# Patient Record
Sex: Female | Born: 1965 | Race: White | Hispanic: No | Marital: Married | Smoking: Never smoker
Health system: Southern US, Community
[De-identification: ages and names within clinical notes are randomized; demographics above are authoritative.]

## PROBLEM LIST (undated history)

## (undated) DIAGNOSIS — M5126 Other intervertebral disc displacement, lumbar region: Secondary | ICD-10-CM

## (undated) DIAGNOSIS — D649 Anemia, unspecified: Secondary | ICD-10-CM

## (undated) DIAGNOSIS — M5136 Other intervertebral disc degeneration, lumbar region: Secondary | ICD-10-CM

## (undated) DIAGNOSIS — Z860100 Personal history of colon polyps, unspecified: Secondary | ICD-10-CM

## (undated) DIAGNOSIS — M1990 Unspecified osteoarthritis, unspecified site: Secondary | ICD-10-CM

## (undated) DIAGNOSIS — T7840XA Allergy, unspecified, initial encounter: Secondary | ICD-10-CM

## (undated) DIAGNOSIS — N200 Calculus of kidney: Secondary | ICD-10-CM

## (undated) DIAGNOSIS — M51369 Other intervertebral disc degeneration, lumbar region without mention of lumbar back pain or lower extremity pain: Secondary | ICD-10-CM

## (undated) HISTORY — PX: ADENOIDECTOMY: SUR15

## (undated) HISTORY — PX: GALLBLADDER SURGERY: SHX652

## (undated) HISTORY — DX: Unspecified osteoarthritis, unspecified site: M19.90

## (undated) HISTORY — DX: Personal history of colonic polyps: Z86.010

## (undated) HISTORY — DX: Other intervertebral disc displacement, lumbar region: M51.26

## (undated) HISTORY — PX: UPPER GASTROINTESTINAL ENDOSCOPY: SHX188

## (undated) HISTORY — PX: TYMPANOSTOMY TUBE PLACEMENT: SHX32

## (undated) HISTORY — PX: TONSILLECTOMY: SUR1361

## (undated) HISTORY — PX: POLYPECTOMY: SHX149

## (undated) HISTORY — DX: Other intervertebral disc degeneration, lumbar region without mention of lumbar back pain or lower extremity pain: M51.369

## (undated) HISTORY — PX: EPIDURAL BLOCK INJECTION: SHX1516

## (undated) HISTORY — DX: Anemia, unspecified: D64.9

## (undated) HISTORY — PX: CHOLECYSTECTOMY: SHX55

## (undated) HISTORY — PX: OTHER SURGICAL HISTORY: SHX169

## (undated) HISTORY — DX: Allergy, unspecified, initial encounter: T78.40XA

## (undated) HISTORY — DX: Personal history of colon polyps, unspecified: Z86.0100

## (undated) HISTORY — DX: Other intervertebral disc degeneration, lumbar region: M51.36

## (undated) HISTORY — DX: Calculus of kidney: N20.0

## (undated) HISTORY — PX: ABDOMINAL HYSTERECTOMY: SHX81

---

## 1997-09-07 ENCOUNTER — Other Ambulatory Visit: Admission: RE | Admit: 1997-09-07 | Discharge: 1997-09-07 | Payer: Self-pay | Admitting: *Deleted

## 1997-09-22 ENCOUNTER — Other Ambulatory Visit: Admission: RE | Admit: 1997-09-22 | Discharge: 1997-09-22 | Payer: Self-pay | Admitting: *Deleted

## 1998-09-15 ENCOUNTER — Other Ambulatory Visit: Admission: RE | Admit: 1998-09-15 | Discharge: 1998-09-15 | Payer: Self-pay | Admitting: *Deleted

## 1999-05-06 ENCOUNTER — Other Ambulatory Visit: Admission: RE | Admit: 1999-05-06 | Discharge: 1999-05-06 | Payer: Self-pay | Admitting: *Deleted

## 1999-05-06 ENCOUNTER — Encounter (INDEPENDENT_AMBULATORY_CARE_PROVIDER_SITE_OTHER): Payer: Self-pay | Admitting: Specialist

## 2000-09-12 ENCOUNTER — Encounter (INDEPENDENT_AMBULATORY_CARE_PROVIDER_SITE_OTHER): Payer: Self-pay | Admitting: Specialist

## 2000-09-12 ENCOUNTER — Other Ambulatory Visit: Admission: RE | Admit: 2000-09-12 | Discharge: 2000-09-12 | Payer: Self-pay | Admitting: *Deleted

## 2002-03-04 ENCOUNTER — Other Ambulatory Visit: Admission: RE | Admit: 2002-03-04 | Discharge: 2002-03-04 | Payer: Self-pay | Admitting: *Deleted

## 2007-12-24 ENCOUNTER — Encounter (INDEPENDENT_AMBULATORY_CARE_PROVIDER_SITE_OTHER): Payer: Self-pay | Admitting: *Deleted

## 2007-12-24 ENCOUNTER — Ambulatory Visit (HOSPITAL_BASED_OUTPATIENT_CLINIC_OR_DEPARTMENT_OTHER): Admission: RE | Admit: 2007-12-24 | Discharge: 2007-12-24 | Payer: Self-pay | Admitting: *Deleted

## 2009-05-12 ENCOUNTER — Ambulatory Visit: Payer: Self-pay | Admitting: Radiology

## 2009-05-12 ENCOUNTER — Emergency Department (HOSPITAL_BASED_OUTPATIENT_CLINIC_OR_DEPARTMENT_OTHER): Admission: EM | Admit: 2009-05-12 | Discharge: 2009-05-12 | Payer: Self-pay | Admitting: Emergency Medicine

## 2010-07-17 LAB — POCT CARDIAC MARKERS: Troponin i, poc: <0.05 ng/mL (ref 0.00–0.09)

## 2010-09-13 NOTE — Op Note (Signed)
NAMEJANDA, CARGO               ACCOUNT NO.:  [id number]   MEDICAL RECORD NO.:  [id number]          PATIENT TYPE:  AMB   LOCATION:  DSC                          FACILITY:  [facility]   PHYSICIAN:  Alfonse Ras, MD   DATE OF BIRTH:  [birth year]   DATE OF PROCEDURE:  12/24/2007  DATE OF DISCHARGE:                               OPERATIVE REPORT   PREOPERATIVE DIAGNOSIS:  Subcutaneous scalp mass.   POSTOPERATIVE DIAGNOSIS:  Subcutaneous scalp mass.   PROCEDURE:  Excision of subcutaneous scalp mass.   ANESTHESIA:  Local MAC.   DESCRIPTION OF PROCEDURE:  The patient was taken to the operating room  and placed in the supine position.  After adequate MAC anesthesia was  induced, a small area of hair was shaved overlying the mass in the  midportion of the scalp.  Lidocaine 1% with epinephrine and bicarb were  injected in the subcutaneous tissues.  An elliptical incision was made  over the mass.  The cyst was removed in its entirety and sent for  pathologic evaluation.  Adequate hemostasis was ensured, and the skin  was closed with interrupted 3-0 Monocryls.  Dermabond dressing was  placed.  The patient tolerated the procedure well and went to PACU in  good condition.      Alfonse Ras, MD  Electronically Signed     KRE/MEDQ  D:  12/24/2007  T:  12/25/2007  Job:  [phone]

## 2011-06-21 ENCOUNTER — Encounter (HOSPITAL_BASED_OUTPATIENT_CLINIC_OR_DEPARTMENT_OTHER): Payer: Self-pay | Admitting: *Deleted

## 2011-06-21 ENCOUNTER — Emergency Department (HOSPITAL_BASED_OUTPATIENT_CLINIC_OR_DEPARTMENT_OTHER)
Admission: EM | Admit: 2011-06-21 | Discharge: 2011-06-21 | Disposition: A | Discharge status: Home / Self Care | Payer: BC Managed Care – PPO | Attending: Emergency Medicine | Admitting: Emergency Medicine

## 2011-06-21 ENCOUNTER — Emergency Department (INDEPENDENT_AMBULATORY_CARE_PROVIDER_SITE_OTHER): Payer: BC Managed Care – PPO

## 2011-06-21 DIAGNOSIS — R51 Headache: Secondary | ICD-10-CM

## 2011-06-21 DIAGNOSIS — S7000XA Contusion of unspecified hip, initial encounter: Secondary | ICD-10-CM

## 2011-06-21 DIAGNOSIS — M25559 Pain in unspecified hip: Secondary | ICD-10-CM

## 2011-06-21 DIAGNOSIS — W010XXA Fall on same level from slipping, tripping and stumbling without subsequent striking against object, initial encounter: Secondary | ICD-10-CM | POA: Insufficient documentation

## 2011-06-21 DIAGNOSIS — S0003XA Contusion of scalp, initial encounter: Secondary | ICD-10-CM | POA: Insufficient documentation

## 2011-06-21 DIAGNOSIS — S0093XA Contusion of unspecified part of head, initial encounter: Secondary | ICD-10-CM

## 2011-06-21 DIAGNOSIS — S0990XA Unspecified injury of head, initial encounter: Secondary | ICD-10-CM

## 2011-06-21 DIAGNOSIS — W19XXXA Unspecified fall, initial encounter: Secondary | ICD-10-CM

## 2011-06-21 DIAGNOSIS — Y9289 Other specified places as the place of occurrence of the external cause: Secondary | ICD-10-CM | POA: Insufficient documentation

## 2011-06-21 DIAGNOSIS — M169 Osteoarthritis of hip, unspecified: Secondary | ICD-10-CM

## 2011-06-21 MED ORDER — OXYCODONE-ACETAMINOPHEN 5-325 MG PO TABS: 1.0000 | ORAL_TABLET | ORAL | Status: AC | PRN

## 2011-06-21 MED ORDER — ONDANSETRON 8 MG PO TBDP
8.0000 mg | ORAL_TABLET | Freq: Once | ORAL | Status: AC
  Administered 2011-06-21: 8 mg via ORAL
  Filled 2011-06-21: qty 1

## 2011-06-21 MED ORDER — ONDANSETRON 8 MG PO TBDP: 8.0000 mg | ORAL_TABLET | Freq: Three times a day (TID) | ORAL | Status: AC | PRN

## 2011-06-21 MED ORDER — MORPHINE SULFATE 4 MG/ML IJ SOLN
4.0000 mg | Freq: Once | INTRAMUSCULAR | Status: AC
  Administered 2011-06-21: 4 mg via INTRAMUSCULAR
  Filled 2011-06-21: qty 1

## 2011-06-21 NOTE — ED Provider Notes (Signed)
History     CSN: [id number]  Arrival date & time 06/21/11  4098   First MD Initiated Contact with Patient 06/21/11 [phone]      Chief Complaint  Patient presents with  . Fall  . Head Injury    (Consider location/radiation/quality/duration/timing/severity/associated sxs/prior treatment) Patient is a 46 y.o. female presenting with fall and head injury. The history is provided by the patient.  Fall The accident occurred less than 1 hour ago. The fall occurred while standing. She fell from a height of 3 to 5 ft. She landed on concrete. The point of impact was the right hip and head. The pain is present in the head. The pain is at a severity of 8/10. She was ambulatory at the scene. There was no entrapment after the fall. There was no drug use involved in the accident. There was no alcohol use involved in the accident. Associated symptoms include headaches. Pertinent negatives include no visual change, no fever, no numbness, no abdominal pain, no bowel incontinence, no nausea, no vomiting, no hearing loss, no loss of consciousness and no tingling. The symptoms are aggravated by standing and ambulation. She has tried nothing for the symptoms.  Head Injury  Pertinent negatives include no numbness and no vomiting.    History reviewed. No pertinent past medical history.  Past Surgical History  Procedure Date  . Abdominal hysterectomy     History reviewed. No pertinent family history.  History  Substance Use Topics  . Smoking status: Never Smoker   . Smokeless tobacco: Not on file  . Alcohol Use: Yes     occ    OB History    Grav Para Term Preterm Abortions TAB SAB Ect Mult Living                  Review of Systems  Constitutional: Negative for fever.  Gastrointestinal: Negative for nausea, vomiting, abdominal pain and bowel incontinence.  Neurological: Positive for headaches. Negative for tingling, loss of consciousness and numbness.  All other systems reviewed and are  negative.    Allergies  Erythrocin  Home Medications   Current Outpatient Rx  Name Route Sig Dispense Refill  . ESTROGENS CONJ SYNTHETIC A 0.3 MG PO TABS Oral Take 0.3 mg by mouth daily.      BP 120/80  Pulse 70  Temp(Src) 97.7 F (36.5 C) (Oral)  Resp 18  Ht 5\' 7"  (1.702 m)  Wt 142 lb (64.411 kg)  BMI 22.24 kg/m2  SpO2 99%  Physical Exam  Vitals reviewed. Constitutional: She is oriented to person, place, and time. She appears well-developed and well-nourished.  HENT:  Head: Normocephalic. Head is without raccoon's eyes and without Battle's sign.    Right Ear: Tympanic membrane, external ear and ear canal normal.  Left Ear: Tympanic membrane, external ear and ear canal normal.  Nose: Nose normal.  Mouth/Throat: Uvula is midline, oropharynx is clear and moist and mucous membranes are normal.  Eyes: Conjunctivae and EOM are normal. Pupils are equal, round, and reactive to light.  Neck: Normal range of motion. Neck supple.  Cardiovascular: Normal rate, regular rhythm and normal heart sounds.   Pulmonary/Chest: Effort normal and breath sounds normal.  Abdominal: Soft. Bowel sounds are normal.  Musculoskeletal: Normal range of motion.  Neurological: She is alert and oriented to person, place, and time. She has normal reflexes.  Skin: Skin is warm and dry.  Psychiatric: She has a normal mood and affect.    ED Course  Procedures (including  critical care time)  Labs Reviewed - No data to display No results found.   No diagnosis found.    MDM          Hilario Quarry, MD 06/22/11 [phone]

## 2011-06-21 NOTE — ED Notes (Signed)
Slipped on patch of ice fell backwards hematoma on back of head and right hip pain states everything went black for a brief second

## 2011-06-21 NOTE — ED Notes (Signed)
Patient transported to CT 

## 2011-06-21 NOTE — Discharge Instructions (Signed)
Contusion A contusion is a deep bruise. Bruises happen when an injury causes bleeding under the skin. Signs of bruising include pain, puffiness (swelling), and discolored skin. The bruise may turn blue, purple, or yellow. HOME CARE   Rest the injured area until the pain and puffiness are better.   Try to limit use of the injured area as much as possible or as told by your doctor.   Put ice on the injured area.   Put ice in a plastic bag.   Place a towel between your skin and the bag.   Leave the ice on for 15 to 20 minutes, 3 to 4 times a day.   Raise (elevate) the injured area above the level of the heart.   Use an elastic bandage to lessen puffiness and motion.   Only take medicine as told by your doctor.   Eat healthy.   See your doctor for a follow-up visit.  GET HELP RIGHT AWAY IF:   There is more redness, puffiness, or pain.   You have a headache, muscle ache, or you feel dizzy and ill.   You have a fever.   The pain is not controlled with medicine.   The bruise is not getting better.   There is yellowish white fluid (pus) coming from the wound.   You lose feeling (numbness) in the injured area.   The bruised area feels cold.   There are new problems.  MAKE SURE YOU:   Understand these instructions.   Will watch your condition.   Will get help right away if you are not doing well or get worse.  Document Released: 10/04/2007 Document Revised: 12/28/2010 Document Reviewed: 10/04/2007 [facility] Patient Information 2012 [place], [place].Head Injury, Adult You have had a head injury that does not appear serious at this time. A concussion is a state of changed mental ability, usually from a blow to the head. You should take clear liquids for the rest of the day and then resume your regular diet. You should not take sedatives or alcoholic beverages for as long as directed by your caregiver after discharge. After injuries such as yours, most problems occur within the  first 24 hours. SYMPTOMS These minor symptoms may be experienced after discharge:  Memory difficulties.   Dizziness.   Headaches.   Double vision.   Hearing difficulties.   Depression.   Tiredness.   Weakness.   Difficulty with concentration.  If you experience any of these problems, you should not be alarmed. A concussion requires a few days for recovery. Many patients with head injuries frequently experience such symptoms. Usually, these problems disappear without medical care. If symptoms last for more than one day, notify your caregiver. See your caregiver sooner if symptoms are becoming worse rather than better. HOME CARE INSTRUCTIONS   During the next 24 hours you must stay with someone who can watch you for the warning signs listed below.  Although it is unlikely that serious side effects will occur, you should be aware of signs and symptoms which may necessitate your return to this location. Side effects may occur up to 7 - 10 days following the injury. It is important for you to carefully monitor your condition and contact your caregiver or seek immediate medical attention if there is a change in your condition. SEEK IMMEDIATE MEDICAL CARE IF:   There is confusion or drowsiness.   You can not awaken the injured person.   There is nausea (feeling sick to your stomach) or  continued, forceful vomiting.   You notice dizziness or unsteadiness which is getting worse, or inability to walk.   You have convulsions or unconsciousness.   You experience severe, persistent headaches not relieved by over-the-counter or prescription medicines for pain. (Do not take aspirin as this impairs clotting abilities). Take other pain medications only as directed.   You can not use arms or legs normally.   There is clear or bloody discharge from the nose or ears.  MAKE SURE YOU:   Understand these instructions.   Will watch your condition.   Will get help right away if you are not  doing well or get worse.  Document Released: 04/17/2005 Document Revised: 12/28/2010 Document Reviewed: 03/05/2009 [facility] Patient Information 2012 [place], [place].

## 2011-06-21 NOTE — ED Notes (Signed)
Patient transported to X-ray 

## 2016-05-01 HISTORY — PX: COLONOSCOPY: SHX174

## 2016-09-08 ENCOUNTER — Ambulatory Visit (INDEPENDENT_AMBULATORY_CARE_PROVIDER_SITE_OTHER): Payer: BLUE CROSS/BLUE SHIELD | Admitting: Allergy & Immunology

## 2016-09-08 ENCOUNTER — Encounter: Payer: Self-pay | Admitting: Allergy & Immunology

## 2016-09-08 VITALS — BP 118/70 | HR 96 | Temp 97.7°F | Resp 16

## 2016-09-08 DIAGNOSIS — J301 Allergic rhinitis due to pollen: Secondary | ICD-10-CM

## 2016-09-08 MED ORDER — LEVOCETIRIZINE DIHYDROCHLORIDE 5 MG PO TABS: 5.0000 mg | ORAL_TABLET | Freq: Every evening | ORAL | 5 refills | Status: DC

## 2016-09-08 MED ORDER — MONTELUKAST SODIUM 10 MG PO TABS: 10.0000 mg | ORAL_TABLET | Freq: Every day | ORAL | 5 refills | Status: DC

## 2016-09-08 NOTE — Patient Instructions (Addendum)
1. Chronic allergic rhinitis - Testing today showed: grasses, weeds, trees, molds, dust mite, and cat. - Avoidance measures provided.  - Continue with fluticasone two sprays per nostril daily. - Continue with azelastine two sprays per nostril daily. - Continue with montelukast 10mg  daily. - Add Xyzal 5mg  daily (samples provided). - Consider allergy shots for long-term control. - Check with your insurance company for pricing and copays. - Call us back if you are interested in starting this.  2. Return in about 3 months (around 12/09/2016).  Please inform us of any Emergency Department visits, hospitalizations, or changes in symptoms. Call us before going to the ED for breathing or allergy symptoms since we might be able to fit you in for a sick visit. Feel free to contact us anytime with any questions, problems, or concerns.  It was a pleasure to meet you today! Happy spring!   Websites that have reliable patient information: 1. American Academy of Asthma, Allergy, and Immunology: www.aaaai.org 2. Food Allergy Research and Education (FARE): foodallergy.org 3. Mothers of Asthmatics: http://www.asthmacommunitynetwork.org 4. American College of Allergy, Asthma, and Immunology: www.acaai.org  Reducing Pollen Exposure  The American Academy of Allergy, Asthma and Immunology suggests the following steps to reduce your exposure to pollen during allergy seasons.    1. Do not hang sheets or clothing out to dry; pollen may collect on these items. 2. Do not mow lawns or spend time around freshly cut grass; mowing stirs up pollen. 3. Keep windows closed at night.  Keep car windows closed while driving. 4. Minimize morning activities outdoors, a time when pollen counts are usually at their highest. 5. Stay indoors as much as possible when pollen counts or humidity is high and on windy days when pollen tends to remain in the air longer. 6. Use air conditioning when possible.  Many air conditioners  have filters that trap the pollen spores. 7. Use a HEPA room air filter to remove pollen form the indoor air you breathe.  Control of Mold Allergen  Mold and fungi can grow on a variety of surfaces provided certain temperature and moisture conditions exist.  Outdoor molds grow on plants, decaying vegetation and soil.  The major outdoor mold, Alternaria and Cladosporium, are found in very high numbers during hot and dry conditions.  Generally, a late Summer - Fall peak is seen for common outdoor fungal spores.  Rain will temporarily lower outdoor mold spore count, but counts rise rapidly when the rainy period ends.  The most important indoor molds are Aspergillus and Penicillium.  Dark, humid and poorly ventilated basements are ideal sites for mold growth.  The next most common sites of mold growth are the bathroom and the kitchen.  Outdoor MicrosoftMold Control 1. Use air conditioning and keep windows closed 2. Avoid exposure to decaying vegetation. 3. Avoid leaf raking. 4. Avoid grain handling. 5. Consider wearing a face mask if working in moldy areas.  Indoor Mold Control 1. Maintain humidity below 50%. 2. Clean washable surfaces with 5% bleach solution. 3. Remove sources e.g. contaminated carpets.  Control of House Dust Mite Allergen    House dust mites play a major role in allergic asthma and rhinitis.  They occur in environments with high humidity wherever human skin, the food for dust mites is found. High levels have been detected in dust obtained from mattresses, pillows, carpets, upholstered furniture, bed covers, clothes and soft toys.  The principal allergen of the house dust mite is found in its feces.  A gram  of dust may contain 1,000 mites and 250,000 fecal particles.  Mite antigen is easily measured in the air during house cleaning activities.    1. Encase mattresses, including the box spring, and pillow, in an air tight cover.  Seal the zipper end of the encased mattresses with wide  adhesive tape. 2. Wash the bedding in water of 130 degrees Farenheit weekly.  Avoid cotton comforters/quilts and flannel bedding: the most ideal bed covering is the dacron comforter. 3. Remove all upholstered furniture from the bedroom. 4. Remove carpets, carpet padding, rugs, and non-washable window drapes from the bedroom.  Wash drapes weekly or use plastic window coverings. 5. Remove all non-washable stuffed toys from the bedroom.  Wash stuffed toys weekly. 6. Have the room cleaned frequently with a vacuum cleaner and a damp dust-mop.  The patient should not be in a room which is being cleaned and should wait 1 hour after cleaning before going into the room. 7. Close and seal all heating outlets in the bedroom.  Otherwise, the room will become filled with dust-laden air.  An electric heater can be used to heat the room. 8. Reduce indoor humidity to less than 50%.  Do not use a humidifier.  Control of Dog or Cat Allergen  Avoidance is the best way to manage a dog or cat allergy. If you have a dog or cat and are allergic to dog or cats, consider removing the dog or cat from the home. If you have a dog or cat but don't want to find it a new home, or if your family wants a pet even though someone in the household is allergic, here are some strategies that may help keep symptoms at bay:  1. Keep the pet out of your bedroom and restrict it to only a few rooms. Be advised that keeping the dog or cat in only one room will not limit the allergens to that room. 2. Don't pet, hug or kiss the dog or cat; if you do, wash your hands with soap and water. 3. High-efficiency particulate air (HEPA) cleaners run continuously in a bedroom or living room can reduce allergen levels over time. 4. Regular use of a high-efficiency vacuum cleaner or a central vacuum can reduce allergen levels. 5. Giving your dog or cat a bath at least once a week can reduce airborne allergen.

## 2016-09-08 NOTE — Progress Notes (Signed)
NEW PATIENT  Date of Service/Encounter:  09/08/16  Referring provider: Wilburn Mylar, MD   Assessment:    Chronic allergic rhinitis (grasses, weeds, trees, molds, dust mite, cat)   Plan/Recommendations:   1. Chronic allergic rhinitis (grasses, weeds, trees, molds, dust mite, cat) - Testing today showed: grasses, weeds, trees, molds, dust mite, and cat. - Avoidance measures provided.  - Continue with fluticasone two sprays per nostril daily. - Continue with azelastine two sprays per nostril daily. - Continue with montelukast 10mg  daily. - Add Xyzal 5mg  daily (samples provided). - Consider allergy shots for long-term control. - Check with your insurance company for pricing and copays. - Call us back if you are interested in starting this.  2. Return in about 3 months (around 12/09/2016).   Subjective:   Angela Adams is a 51 y.o. female presenting today for evaluation of  Chief Complaint  Patient presents with  . Allergies    Angela Adams has a history of the following: Patient Active Problem List   Diagnosis Date Noted  . Non-seasonal allergic rhinitis due to pollen 09/08/2016    History obtained from: chart review and patient.  Angela Adams was referred by Wilburn Mylar, MD.     Angela Adams is a 51 y.o. female presenting for an allergy evaluation. She is slightly tearful today because it is the second anniversary of her mother's passing. She reports today that she has had seasonal allergy symptoms for a number of years. However, over the last 1-2 years, her symptoms have persisted for much longer. Typically, she would have bad symptoms in the spring and the fall. However, in 2017, she had symptoms throughout the summer as well as the winter. This spring is been particularly bad for her as well. Her prominent symptoms include nasal congestion, postnasal drip, and coughing. She also endorses mucous production with chest heaviness. She has never had any wheezing.  She does have an inhaler at home, which was prescribed during an episode of bronchitis. However, she never uses her inhaler.  She has tried multiple medications in the past. Currently, she is on Zyrtec 10 mg daily. Singulair 10 mg daily was added within the last year. She also has 2 nasal sprays: Astelin and Flonase. She uses 2 sprays of each of these daily. Even with all of these medications, her symptoms are not completely alleviated.  She has had allergies for several years. However her symptoms have persisted for one year now, therefore she came here for [facility] evaluation. Currently she is on Zyrtec, montelukast, and two inhalers (astelin and Flonase). She typically only has nasal congestion, postnasal drip, and coughing. She does have chest heaviness. She does not have a diagnosis of asthma but she does have an albuterol inhaler that was prescribed when she had bronchitis. She never had any relief during the winter at all. [place] pear trees seem to truly worsen her symptoms.   Otherwise, there is no history of other atopic diseases, including asthma, drug allergies, food allergies, stinging insect allergies, or urticaria. There is no significant infectious history. She estimates that she receives antibiotics 0-1 times per year. Vaccinations are up to date.    Past Medical History: Patient Active Problem List   Diagnosis Date Noted  . Non-seasonal allergic rhinitis due to pollen 09/08/2016    Medication List:  Allergies as of 09/08/2016      Reactions   Tetracyclines & Related Hives   Erythrocin Hives   Hydrocodone Hives  Medication List       Accurate as of 09/08/16 12:19 PM. Always use your most recent med list.          amphetamine-dextroamphetamine 30 MG 24 hr capsule Commonly known as:  ADDERALL XR Take 30 mg by mouth daily. Start taking on:  10/10/2016   azelastine 0.1 % nasal spray Commonly known as:  ASTELIN Place 2 sprays into both nostrils 2 (two) times daily.     cetirizine 10 MG tablet Commonly known as:  ZYRTEC Take 10 mg by mouth daily.   estradiol 1 MG tablet Commonly known as:  ESTRACE Take 1 mg by mouth daily.   estrogens conjugated (synthetic A) 0.3 MG tablet Commonly known as:  CENESTIN Take 0.3 mg by mouth daily.   Fish Oil 1000 MG Caps Take 1 tablet by mouth 2 (two) times daily.   fluticasone 50 MCG/ACT nasal spray Commonly known as:  FLONASE Place 2 sprays into both nostrils 2 (two) times daily.   Garlic 100 MG Tabs Take by mouth.   montelukast 10 MG tablet Commonly known as:  SINGULAIR Take 10 mg by mouth daily.   vitamin A 1610910000 UNIT capsule Take 10,000 Units by mouth daily.   VITAMIN B COMPLEX PO Take by mouth.   vitamin C 500 MG tablet Commonly known as:  ASCORBIC ACID Take 500 mg by mouth daily.   Vitamin D3 3000 units Tabs Take by mouth.   vitamin E 1000 UNIT capsule Take 1,000 Units by mouth daily.   ZINC PO Take by mouth.       Birth History: non-contributory.   Developmental History: non-contributory.   Past Surgical History: Past Surgical History:  Procedure Laterality Date  . ABDOMINAL HYSTERECTOMY    . ADENOIDECTOMY    . complete hysterectomy    . GALLBLADDER SURGERY    . TONSILLECTOMY    . TYMPANOSTOMY TUBE PLACEMENT       Family History: Family History  Problem Relation Age of Onset  . Allergic rhinitis Neg Hx   . Angioedema Neg Hx   . Asthma Neg Hx   . Eczema Neg Hx   . Immunodeficiency Neg Hx   . Urticaria Neg Hx   a  Social History: Angela Adams lives at home with her husband. She has a dog and a cat home. 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 She has a 52yo and a 52yo who are out of the home. They live in a home that is 52 years old. There is carpeting throughout the home. They have electric heating and central cooling. She does have dust mite coverings on her bedding. There is no tobacco exposure.     Review  of Systems: a 14-point review of systems is pertinent for what is mentioned in HPI.  Otherwise, all other systems were negative. Constitutional: negative other than that listed in the HPI Eyes: negative other than that listed in the HPI Ears, nose, mouth, throat, and face: negative other than that listed in the HPI Respiratory: negative other than that listed in the HPI Cardiovascular: negative other than that listed in the HPI Gastrointestinal: negative other than that listed in the HPI Genitourinary: negative other than that listed in the HPI Integument: negative other than that listed in the HPI Hematologic: negative other than that listed in the HPI Musculoskeletal: negative other than that listed in the HPI Neurological: negative other than that listed in the HPI Allergy/Immunologic: negative other than that  listed in the HPI    Objective:   Blood pressure 118/70, pulse 96, temperature 97.7 F (36.5 C), temperature source Oral, resp. rate 16, SpO2 97 %. There is no height or weight on file to calculate BMI.   Physical Exam:  General: Alert, interactive, in no acute distress. Pleasant female.  Eyes: No conjunctival injection present on the right, No conjunctival injection present on the left, PERRL bilaterally, No discharge on the right, No discharge on the left, No Horner-Trantas dots present and allergic shiners present bilaterally Ears: Right TM pearly gray with normal light reflex, Left TM pearly gray with normal light reflex, Right TM intact without perforation and Left TM intact without perforation.  Nose/Throat: External nose within normal limits and septum midline, turbinates markedly edematous and pale with clear discharge, post-pharynx erythematous with cobblestoning in the posterior oropharynx. Tonsils 2+ without exudates Neck: Supple without thyromegaly.  Adenopathy: no enlarged lymph nodes appreciated in the anterior cervical, occipital, axillary, epitrochlear,  inguinal, or popliteal regions Lungs: Clear to auscultation without wheezing, rhonchi or rales. No increased work of breathing. CV: Normal S1/S2, no murmurs. Capillary refill <2 seconds.  Abdomen: Nondistended, nontender. No guarding or rebound tenderness. Bowel sounds faint and present in all fields  Skin: Warm and dry, without lesions or rashes. Extremities:  No clubbing, cyanosis or edema. Neuro:   Grossly intact. No focal deficits appreciated. Responsive to questions.  Diagnostic studies:   Allergy Studies:   Indoor/Outdoor Percutaneous Adult Environmental Panel: negative to the entire panel with adequate controls.  Indoor/Outdoor Selected Intradermal Environmental Panel: positive to [employer], Grass mix, ragweed mix, weed mix, tree mix, mold mix #2, mold mix #3, mold mix #4, cat and mite mix. Otherwise negative with adequate controls.      [name], MD FAAAAI [facility]

## 2016-09-22 ENCOUNTER — Telehealth: Payer: Self-pay | Admitting: Allergy & Immunology

## 2016-09-22 ENCOUNTER — Ambulatory Visit: Payer: Self-pay | Admitting: Allergy & Immunology

## 2016-09-22 ENCOUNTER — Other Ambulatory Visit: Payer: Self-pay

## 2016-09-22 MED ORDER — EPINEPHRINE 0.3 MG/0.3ML IJ SOAJ: 0.3000 mg | Freq: Once | INTRAMUSCULAR | 1 refills | Status: AC

## 2016-09-22 NOTE — Telephone Encounter (Signed)
Lm for pt to call us back  

## 2016-09-22 NOTE — Telephone Encounter (Signed)
Spoke to pt and sent in auvi-q as she will be starting injections

## 2016-09-22 NOTE — Telephone Encounter (Signed)
[facility] and starting allergy injections

## 2016-10-03 NOTE — Addendum Note (Signed)
Addended by: Alfonse SpruceGALLAGHER, Chandi Nicklin LOUIS on: 10/03/2016 02:03 PM   Modules accepted: Orders

## 2016-10-03 NOTE — Progress Notes (Signed)
IT script written and routed to the IT Team.  Seba Madole, MD FAAAAI [facility]  

## 2016-10-04 NOTE — Progress Notes (Signed)
Vials to be made 10-04-16 jm 

## 2016-10-05 DIAGNOSIS — J301 Allergic rhinitis due to pollen: Secondary | ICD-10-CM | POA: Diagnosis not present

## 2016-10-06 DIAGNOSIS — J3089 Other allergic rhinitis: Secondary | ICD-10-CM | POA: Diagnosis not present

## 2016-10-13 ENCOUNTER — Ambulatory Visit (INDEPENDENT_AMBULATORY_CARE_PROVIDER_SITE_OTHER): Payer: BLUE CROSS/BLUE SHIELD

## 2016-10-13 DIAGNOSIS — J301 Allergic rhinitis due to pollen: Secondary | ICD-10-CM | POA: Diagnosis not present

## 2016-10-13 NOTE — Progress Notes (Signed)
Immunotherapy   Patient Details  Name: Angela Adams MRN: [id number] Date of Birth: 1965-07-06  10/13/2016  Angela Adams started injections for  Mold, Pollen, Cat, DM Following schedule: B  Frequency:1-2 times weekly Epi-Pen:Auvi-Q available Consent signed and patient instructions given.  No reaction to first injection.  [name] 10/13/2016, 1:57 PM

## 2016-10-17 ENCOUNTER — Ambulatory Visit (INDEPENDENT_AMBULATORY_CARE_PROVIDER_SITE_OTHER): Payer: BLUE CROSS/BLUE SHIELD

## 2016-10-17 ENCOUNTER — Telehealth: Payer: Self-pay

## 2016-10-17 DIAGNOSIS — J309 Allergic rhinitis, unspecified: Secondary | ICD-10-CM

## 2016-10-17 NOTE — Telephone Encounter (Signed)
Reviewed note. Please ask her to take two Xyzal prior to the injection next time to see if that helps.   Thanks, [name], MD FAAAAI [facility]

## 2016-10-17 NOTE — Telephone Encounter (Signed)
Pt came into for her second injection. She stated after the first injection she had eye swelling and itching all over. SHe has taken her antihistamine and was doing benadryl also. Is there anything else to help her? Blue vial 0.1 given today  Please advie

## 2016-10-17 NOTE — Telephone Encounter (Signed)
Spoke with pt and informed her of what dr gallagher has stated

## 2016-10-23 ENCOUNTER — Ambulatory Visit (INDEPENDENT_AMBULATORY_CARE_PROVIDER_SITE_OTHER): Payer: BLUE CROSS/BLUE SHIELD | Admitting: *Deleted

## 2016-10-23 DIAGNOSIS — J309 Allergic rhinitis, unspecified: Secondary | ICD-10-CM

## 2016-10-30 ENCOUNTER — Ambulatory Visit (INDEPENDENT_AMBULATORY_CARE_PROVIDER_SITE_OTHER): Payer: BLUE CROSS/BLUE SHIELD

## 2016-10-30 ENCOUNTER — Telehealth: Payer: Self-pay

## 2016-10-30 DIAGNOSIS — J309 Allergic rhinitis, unspecified: Secondary | ICD-10-CM | POA: Diagnosis not present

## 2016-10-30 MED ORDER — PREDNISONE 10 MG PO TABS: 10.0000 mg | ORAL_TABLET | Freq: Two times a day (BID) | ORAL | 0 refills | Status: AC

## 2016-10-30 MED ORDER — CRISABOROLE 2 % EX OINT: 1.0000 "application " | TOPICAL_OINTMENT | Freq: Two times a day (BID) | CUTANEOUS | 1 refills | Status: DC | PRN

## 2016-10-30 NOTE — Addendum Note (Signed)
Addended by: Alfonse SpruceGALLAGHER, Zaidee Rion LOUIS on: 10/30/2016 12:27 PM   Modules accepted: Orders

## 2016-10-30 NOTE — Telephone Encounter (Signed)
I'm trying to do a PA for eucrisa so I left pt. A message to call office to see if she has tried any other creams. I did review her meds and didn't see any other creams on pt.'s med list.

## 2016-10-30 NOTE — Telephone Encounter (Signed)
Pt came in for immunotheraphy today and mentioned she is still having very itchy eyes with some swelling and hives. SHe stated it has gotten worse since starting injections. She is apply hydrocortisone with no relief.  Please advise

## 2016-10-30 NOTE — Telephone Encounter (Signed)
I did call the patient to discuss her symptoms a little more. She reports that she never had problems with hives prior to starting the allergy injections. The hives this time around really are only around her eyes, including her eyelid. She is interested in a topical ointment to help control the symptoms. She does not want an eyedrop because the eyes themselves are fine, this is mostly involving the skin around her eyelid and around the entire eye itself.   Prior to starting the shots, she never had a problem with hives. She has endorsed intense itching, particularly involving her eyes, but the hives are new symptom. She otherwise denies systemic reactions. She has been changed to schedule A.  Plan:  - Start decreasing the 2 applications twice daily as needed (safe to use on the face). - I did ask her to look online for a co-pay card. - Short low-dose burst of prednisone sent in (10mg  twice daily for five days).  - She will call with an update. - If she continues to have these problems, it might be prudent to initiate treatment with Xolair for chronic urticaria.  - However, she will need to have the hives for a period of 6 weeks to justify this.  - Xolair will have the added benefit of modulating allergic reactions with build up of her immunotherapy.   Patient is in agreement with the plan of action.  Malachi BondsJoel Zolton Dowson, MD FAAAAI [facility]

## 2016-10-31 NOTE — Telephone Encounter (Signed)
Spoke to patient and she has tried hydrocortisone cream only.

## 2016-11-06 ENCOUNTER — Ambulatory Visit (INDEPENDENT_AMBULATORY_CARE_PROVIDER_SITE_OTHER): Payer: BLUE CROSS/BLUE SHIELD

## 2016-11-06 DIAGNOSIS — J309 Allergic rhinitis, unspecified: Secondary | ICD-10-CM

## 2016-11-15 ENCOUNTER — Ambulatory Visit (INDEPENDENT_AMBULATORY_CARE_PROVIDER_SITE_OTHER): Payer: BLUE CROSS/BLUE SHIELD | Admitting: *Deleted

## 2016-11-15 DIAGNOSIS — J309 Allergic rhinitis, unspecified: Secondary | ICD-10-CM | POA: Diagnosis not present

## 2016-11-21 ENCOUNTER — Ambulatory Visit (INDEPENDENT_AMBULATORY_CARE_PROVIDER_SITE_OTHER): Payer: BLUE CROSS/BLUE SHIELD

## 2016-11-21 DIAGNOSIS — J309 Allergic rhinitis, unspecified: Secondary | ICD-10-CM

## 2016-11-28 ENCOUNTER — Ambulatory Visit (INDEPENDENT_AMBULATORY_CARE_PROVIDER_SITE_OTHER): Payer: BLUE CROSS/BLUE SHIELD | Admitting: *Deleted

## 2016-11-28 DIAGNOSIS — J309 Allergic rhinitis, unspecified: Secondary | ICD-10-CM

## 2016-12-04 ENCOUNTER — Ambulatory Visit (INDEPENDENT_AMBULATORY_CARE_PROVIDER_SITE_OTHER): Payer: BLUE CROSS/BLUE SHIELD

## 2016-12-04 DIAGNOSIS — J309 Allergic rhinitis, unspecified: Secondary | ICD-10-CM

## 2016-12-08 ENCOUNTER — Encounter: Payer: Self-pay | Admitting: Allergy & Immunology

## 2016-12-08 ENCOUNTER — Ambulatory Visit (INDEPENDENT_AMBULATORY_CARE_PROVIDER_SITE_OTHER): Payer: BLUE CROSS/BLUE SHIELD | Admitting: Allergy & Immunology

## 2016-12-08 VITALS — BP 110/60 | HR 90 | Resp 16

## 2016-12-08 DIAGNOSIS — J301 Allergic rhinitis due to pollen: Secondary | ICD-10-CM | POA: Diagnosis not present

## 2016-12-08 MED ORDER — FEXOFENADINE HCL 180 MG PO TABS: 180.0000 mg | ORAL_TABLET | Freq: Every day | ORAL | 5 refills | Status: DC

## 2016-12-08 NOTE — Patient Instructions (Addendum)
1. Chronic allergic rhinitis (grasses, weeds, trees, molds, dust mite, and cat)  - Continue with fluticasone two sprays per nostril daily as needed.  - Continue with azelastine two sprays per nostril daily as needed.  - Continue with montelukast 10mg  daily.  - Continue Xyzal 5mg  daily (samples provided). - Add Allexgra fexofenadine 180mg  in the morning to see if this helps your eye symptoms.   2. Return in about 6 months (around 06/10/2017).  Please inform us of any Emergency Department visits, hospitalizations, or changes in symptoms. Call us before going to the ED for breathing or allergy symptoms since we might be able to fit you in for a sick visit. Feel free to contact us anytime with any questions, problems, or concerns.  It was a pleasure to see you again today! Enjoy the rest of the summer!   Websites that have reliable patient information: 1. American Academy of Asthma, Allergy, and Immunology: www.aaaai.org 2. Food Allergy Research and Education (FARE): foodallergy.org 3. Mothers of Asthmatics: http://www.asthmacommunitynetwork.org 4. American College of Allergy, Asthma, and Immunology: www.acaai.org

## 2016-12-08 NOTE — Progress Notes (Signed)
FOLLOW UP  Date of Service/Encounter:  12/08/16   Assessment:   Non-seasonal allergic rhinitis (grasses, weeds, trees, molds, dust mite, and cat)     Plan/Recommendations:   1. Chronic allergic rhinitis (grasses, weeds, trees, molds, dust mite, and cat)   - Continue with fluticasone two sprays per nostril daily as needed.  - Continue with azelastine two sprays per nostril daily as needed. - Continue with montelukast 10mg  daily.  - Continue Xyzal 5mg  daily (samples provided). - Add Allexgra fexofenadine 180mg  in the morning to see if this helps your eye symptoms.  - She is having worsening facial itching associated with allergy injections, possibly, although I think that her makeup load is contributing to her symptoms as well. - She might have some underlying contact dermatitis from her makeup products, so we should consider patch testing in the future. - We will add additional antihistamines in the morning to see if this will take the edge off of her symptoms. - If there is no improvement in two weeks, we will plan to switch to Schedule A.   2. Return in about 6 months (around 06/10/2017).   Subjective:   Angela Adams is a 51 y.o. female presenting today for follow up of  Chief Complaint  Patient presents with  . Allergic Rhinitis     itching, eye swelling, lip swelling.     Angela Haringracey D Adams has a history of the following: Patient Active Problem List   Diagnosis Date Noted  . Non-seasonal allergic rhinitis due to pollen 09/08/2016    History obtained from: chart review and patient.  Angela Adams's Primary Care Provider is Wilburn MylarKelly, Samuel S, MD.     Angela Adams is a 51 y.o. female presenting for a follow up visit. She was last seen as a new patient in May 2018. At that time, she was suffering from severe chronic allergic rhinitis. She had testing that was positive to grasses, weeds, trees, molds, dust mite, and cat. We continued her on fluticasone nasal spray as well as  azelastine nasal spray. We continued her on singulair and added on Xyzal 5mg  daily. She did start allergy injection on 10/13/16.    Since the last visit, she has done well. Angela Adams is on allergen immunotherapy. She receives two injections. Immunotherapy script #1 contains trees, weeds, grasses, dust mites and cat. She currently receives 0.3215mL of the GOLD vial (1/10,000). Immunotherapy script #2 contains molds. She currently receives 0.7515mL of the GOLD vial (1/10,000). She started shots June of 2018 and reached not reached maintenance.  She is concerned that she is developing redness around her eyes. This is all new since the shots started. She is doing Xyzal on a daily basis, takes at night. She remains on the Singulair as well the nasal sprays as needed. She has symptoms even dating back to starting her Blue Vials, although it was not as serious with the Blue Vials. She denies that this was an issue before starting allergy shots. We did try prescribing Eucrisa to see if this would help; unfortunately this did not seem to alleviate the itching completely. She does apply it twice daily and reports some stinging during the initial applications.   Otherwise, there have been no changes to her past medical history, surgical history, family history, or social history.    Review of Systems: a 14-point review of systems is pertinent for what is mentioned in HPI.  Otherwise, all other systems were negative. Constitutional: negative other than that listed in  the HPI Eyes: negative other than that listed in the HPI Ears, nose, mouth, throat, and face: negative other than that listed in the HPI Respiratory: negative other than that listed in the HPI Cardiovascular: negative other than that listed in the HPI Gastrointestinal: negative other than that listed in the HPI Genitourinary: negative other than that listed in the HPI Integument: negative other than that listed in the HPI Hematologic: negative other than  that listed in the HPI Musculoskeletal: negative other than that listed in the HPI Neurological: negative other than that listed in the HPI Allergy/Immunologic: negative other than that listed in the HPI    Objective:   Blood pressure 110/60, pulse 90, resp. rate 16, SpO2 97 %. There is no height or weight on file to calculate BMI.   Physical Exam:  General: Alert, interactive, in no acute distress. Pleasant and talkative. Lots of makeup. Eyes: No conjunctival injection present on the right, No conjunctival injection present on the left, PERRL bilaterally, No discharge on the right, No discharge on the left and No Horner-Trantas dots present Ears: Right TM pearly gray with normal light reflex, Left TM pearly gray with normal light reflex, Right TM intact without perforation and Left TM intact without perforation.  Nose/Throat: External nose within normal limits and septum midline, turbinates edematous and pale with clear discharge, post-pharynx erythematous without cobblestoning in the posterior oropharynx. Tonsils 2+ without exudates Neck: Supple without thyromegaly. Lungs: Clear to auscultation without wheezing, rhonchi or rales. No increased work of breathing. CV: Normal S1/S2, no murmurs. Capillary refill <2 seconds.  Skin: Warm and dry, without lesions or rashes. Neuro:   Grossly intact. No focal deficits appreciated. Responsive to questions.   Diagnostic studies: none      [name], MD [facility]

## 2016-12-22 ENCOUNTER — Ambulatory Visit (INDEPENDENT_AMBULATORY_CARE_PROVIDER_SITE_OTHER): Payer: BLUE CROSS/BLUE SHIELD

## 2016-12-22 DIAGNOSIS — J309 Allergic rhinitis, unspecified: Secondary | ICD-10-CM

## 2017-02-01 ENCOUNTER — Ambulatory Visit (INDEPENDENT_AMBULATORY_CARE_PROVIDER_SITE_OTHER): Payer: BLUE CROSS/BLUE SHIELD

## 2017-02-01 DIAGNOSIS — J309 Allergic rhinitis, unspecified: Secondary | ICD-10-CM

## 2017-02-02 ENCOUNTER — Encounter: Payer: Self-pay | Admitting: Family Medicine

## 2017-02-02 ENCOUNTER — Ambulatory Visit (INDEPENDENT_AMBULATORY_CARE_PROVIDER_SITE_OTHER): Payer: BLUE CROSS/BLUE SHIELD | Admitting: Pediatrics

## 2017-02-02 VITALS — BP 108/68 | HR 77 | Temp 97.8°F | Resp 16 | Ht 67.0 in | Wt 139.0 lb

## 2017-02-02 DIAGNOSIS — J3089 Other allergic rhinitis: Secondary | ICD-10-CM | POA: Diagnosis not present

## 2017-02-02 DIAGNOSIS — L258 Unspecified contact dermatitis due to other agents: Secondary | ICD-10-CM | POA: Diagnosis not present

## 2017-02-02 DIAGNOSIS — L259 Unspecified contact dermatitis, unspecified cause: Secondary | ICD-10-CM | POA: Insufficient documentation

## 2017-02-02 MED ORDER — DESONIDE 0.05 % EX OINT: TOPICAL_OINTMENT | CUTANEOUS | 5 refills | Status: DC

## 2017-02-02 NOTE — Progress Notes (Signed)
  [address] [place] [address] Dept: [phone]  FOLLOW UP NOTE  Patient ID: Angela Adams, female    DOB: 1965/12/30  Age: 51 y.o. MRN: [id number] Date of Office Visit: 02/02/2017  Assessment  Chief Complaint: Itchy Eye and Pruritus  HPI Angela Adams presents for evaluation of an itchy rash around her eyes. She started allergy injections in June  of this year. The rash began after she was started on allergy injections. The rash became worse in the past month. Eucrisa gave burning of the skin where she applied. She has never had eczema. She does not report a contact dermatitis. She has never had asthmatic  symptoms  She is on a  1-10,000 Gold vial to mold and a 1-10,000 Gold vial to pollen, cats and dust mite . She has not had any local reactions  Current medications are fexofenadine 180 mg in the morning, Xyzal 5 mg at night, montelukast 10 mg once a day, fluticasone 2 sprays per nostril once a day, azelastine 0.1%- 2 sprays per nostril once a day if needed. Her other medications are outlined in the chart   Drug Allergies:  Allergies  Allergen Reactions  . Tetracyclines & Related Hives  . Erythrocin Hives  . Hydrocodone Hives    Physical Exam: BP 108/68   Pulse 77   Temp 97.8 F (36.6 C) (Oral)   Resp 16   Ht  (1.702 m)   Wt 139 lb (63 kg)   SpO2 96%   BMI 21.77 kg/m    Physical Exam  Constitutional: She is oriented to person, place, and time. She appears well-developed and well-nourished.  HENT:  Eyes normal. Ears normal. Nose normal. Pharynx normal.  Neck: Neck supple.  Cardiovascular:  S1 and S2 normal no murmurs  Pulmonary/Chest:  Clear to percussion and auscultation  Lymphadenopathy:    She has no cervical adenopathy.  Neurological: She is alert and oriented to person, place, and time.  Skin:  She had an erythematous papulosquamous eruption around her eyelids with some thickening of the skin  Psychiatric: She has a normal mood and  affect. Her behavior is normal. Judgment and thought content normal.  Vitals reviewed.   Diagnostics:  none  Assessment and Plan: 1. Contact dermatitis due to other agent, unspecified contact dermatitis type   2. Other allergic rhinitis     No orders of the defined types were placed in this encounter.   Patient Instructions  Stop allergy injections for one month Fluticasone 2 sprays per nostril once a day if needed for stuffy nose Allegra 180 mg once a day for runny nose or itchy eyes Azelastine 0.1%- 2 sprays per nostril twice a day if needed for sinus headache Add prednisone 20 mg twice a day for 3 days, 20 mg on the fourth day, 10 mg on the fifth day Stop Eucrisa Desonide 0.05% ointment-apply twice a day if needed to red itchy areas Call us  if you're not doing well on this treatment plan If she doesn't clear up, she may need patch testing. This rash looks like an eczematoid rash or contact dermatitis    Return in about 4 weeks (around 03/02/2017).    Thank you for the opportunity to care for this patient.  Please do not hesitate to contact me with questions.  Tonette Bihari, M.D.  [facility] [address] [place], [place] [address] [phone]

## 2017-02-02 NOTE — Patient Instructions (Addendum)
Stop allergy injections for one month Fluticasone 2 sprays per nostril once a day if needed for stuffy nose Allegra 180 mg once a day for runny nose or itchy eyes Azelastine 0.1%- 2 sprays per nostril twice a day if needed for sinus headache Add prednisone 20 mg twice a day for 3 days, 20 mg on the fourth day, 10 mg on the fifth day Stop Eucrisa Desonide 0.05% ointment-apply twice a day if needed to red itchy areas Call us  if you're not doing well on this treatment plan If she doesn't clear up, she may need patch testing. This rash looks like an eczematoid rash or contact dermatitis

## 2017-04-10 ENCOUNTER — Other Ambulatory Visit: Payer: Self-pay | Admitting: Allergy

## 2017-04-10 MED ORDER — MONTELUKAST SODIUM 10 MG PO TABS: 10.0000 mg | ORAL_TABLET | Freq: Every day | ORAL | 5 refills | Status: DC

## 2017-04-11 ENCOUNTER — Other Ambulatory Visit: Payer: Self-pay

## 2017-04-11 MED ORDER — LEVOCETIRIZINE DIHYDROCHLORIDE 5 MG PO TABS: 5.0000 mg | ORAL_TABLET | Freq: Every evening | ORAL | 0 refills | Status: DC

## 2017-04-13 ENCOUNTER — Other Ambulatory Visit: Payer: Self-pay

## 2017-04-16 ENCOUNTER — Other Ambulatory Visit: Payer: Self-pay | Admitting: Allergy

## 2017-04-16 MED ORDER — MONTELUKAST SODIUM 10 MG PO TABS: 10.0000 mg | ORAL_TABLET | Freq: Every day | ORAL | 5 refills | Status: DC

## 2017-05-18 ENCOUNTER — Other Ambulatory Visit: Payer: Self-pay

## 2017-05-18 MED ORDER — LEVOCETIRIZINE DIHYDROCHLORIDE 5 MG PO TABS: 5.0000 mg | ORAL_TABLET | Freq: Every evening | ORAL | 3 refills | Status: DC

## 2017-05-21 ENCOUNTER — Other Ambulatory Visit: Payer: Self-pay | Admitting: Allergy

## 2017-05-21 MED ORDER — LEVOCETIRIZINE DIHYDROCHLORIDE 5 MG PO TABS: 5.0000 mg | ORAL_TABLET | Freq: Every evening | ORAL | 1 refills | Status: DC

## 2017-06-15 ENCOUNTER — Ambulatory Visit (INDEPENDENT_AMBULATORY_CARE_PROVIDER_SITE_OTHER): Payer: BLUE CROSS/BLUE SHIELD | Admitting: Allergy & Immunology

## 2017-06-15 ENCOUNTER — Encounter: Payer: Self-pay | Admitting: Allergy & Immunology

## 2017-06-15 VITALS — BP 120/80 | HR 104 | Temp 98.5°F | Resp 16

## 2017-06-15 DIAGNOSIS — J3089 Other allergic rhinitis: Secondary | ICD-10-CM

## 2017-06-15 DIAGNOSIS — L258 Unspecified contact dermatitis due to other agents: Secondary | ICD-10-CM

## 2017-06-15 DIAGNOSIS — J302 Other seasonal allergic rhinitis: Secondary | ICD-10-CM | POA: Diagnosis not present

## 2017-06-15 NOTE — Progress Notes (Signed)
FOLLOW UP  Date of Service/Encounter:  06/15/17   Assessment:   Contact dermatitis - likely due to cosmetics  Seasonal and perennial allergic rhinitis (grasses, weeds, trees, molds, dust mite, and cat)    Plan/Recommendations:   1. Chronic allergic rhinitis (grasses, weeds, trees, molds, dust mite, and cat)  - Continue with fluticasone two sprays per nostril daily. - Continue with montelukast 10mg  daily.  - Continue Xyzal 5mg  daily. - Continue with Allexgra fexofenadine 180mg  in the morning.  2. Likely contact dermatitis - Make an appointment for patch testing. - This will require a Wednesday and Friday visit.   3. Return in about 10 days (around 06/25/2017).  Subjective:   Angela Adams is a 52 y.o. female presenting today for follow up of  Chief Complaint  Patient presents with  . Allergies  . Nasal Congestion  . Itchy Eye    Angela Adams has a history of the following: Patient Active Problem List   Diagnosis Date Noted  . Other allergic rhinitis 02/02/2017  . Contact dermatitis 02/02/2017  . Non-seasonal allergic rhinitis due to pollen 09/08/2016    History obtained from: chart review and patient.  Angela Adams's Primary Care Provider is Wilburn MylarKelly, Samuel S, MD.     Angela Adams is a 52 y.o. female presenting for a follow up visit. She was last seen in August 2018. She has testing that was positive to grasses, weeds, trees, molds, dust mites, and cat. At that time, she has having some problems with rashes associated with her allergen immunotherapy. At the last visit, she was developing redness around her eyes that were associated with her allergy shots. Therefore we added on some antihistamines to see if this would help. We added on Eucrisa which resulted in burning. Evidently this regimen did not help and Dr. Beaulah DinningBardelas recommended stopping the shots. Desonide was added with some improvement.   Since the last visit, she has continued to have nasal congestion and  rhinorrhea which has worsened since stopping the shots. She has continued to have marked ocular symptoms as well. She did stop the shots, which did help with her redness in her face but only to a certain extent. Overall, she continues to have this redness. She was on [place] which caused some burning. She was then placed on Desonide which she uses daily on her face. So she is continuing to have the rash which is cleared up with the use of daily Desonide.   She continues to remains skeptical that this has anything to do with her makeup. She tells me today that she was put on Ossipeelinique when she was a teenager because of her sensitive skin and has never used any other type of makeup.   She remains on Xyzal 5mg  daily at night, montelukast 10mg  daily, Allegra 180mg  daily in the morning. Since stopping the shots, she has been unhappy with her control of her allergic rhinitis symptoms. Ideally, she would like to restart the shots since her symptoms have worsened.   Otherwise, there have been no changes to her past medical history, surgical history, family history, or social history.    Review of Systems: a 14-point review of systems is pertinent for what is mentioned in HPI.  Otherwise, all other systems were negative. Constitutional: negative other than that listed in the HPI Eyes: negative other than that listed in the HPI Ears, nose, mouth, throat, and face: negative other than that listed in the HPI Respiratory: negative other than that listed  in the HPI Cardiovascular: negative other than that listed in the HPI Gastrointestinal: negative other than that listed in the HPI Genitourinary: negative other than that listed in the HPI Integument: negative other than that listed in the HPI Hematologic: negative other than that listed in the HPI Musculoskeletal: negative other than that listed in the HPI Neurological: negative other than that listed in the HPI Allergy/Immunologic: negative other than that  listed in the HPI    Objective:   Blood pressure 120/80, pulse (!) 104, temperature 98.5 F (36.9 C), temperature source Oral, resp. rate 16, SpO2 97 %. There is no height or weight on file to calculate BMI.   Physical Exam:  General: Alert, interactive, in no acute distress. Pleasant and talkative female.  Eyes: No conjunctival injection bilaterally, no discharge on the right, no discharge on the left and no Horner-Trantas dots present. PERRL bilaterally. EOMI without pain. No photophobia.  Ears: Right TM pearly gray with normal light reflex, Left TM pearly gray with normal light reflex, Right TM intact without perforation and Left TM intact without perforation.  Nose/Throat: External nose within normal limits and septum midline. Turbinates markedly edematous with clear discharge. Posterior oropharynx erythematous without cobblestoning in the posterior oropharynx. Tonsils 2+ without exudates.  Tongue without thrush. Lungs: Clear to auscultation without wheezing, rhonchi or rales. No increased work of breathing. CV: Normal S1/S2. No murmurs. Capillary refill <2 seconds.  Skin: Warm and dry, without lesions or rashes. Neuro:   Grossly intact. No focal deficits appreciated. Responsive to questions.  Diagnostic studies: none     [name], MD [facility] [place]

## 2017-06-15 NOTE — Patient Instructions (Addendum)
1. Chronic allergic rhinitis (grasses, weeds, trees, molds, dust mite, and cat)  - Continue with fluticasone two sprays per nostril daily. - Continue with montelukast 10mg  daily.  - Continue Xyzal 5mg  daily. - Continue with Allexgra fexofenadine 180mg  in the morning.  2. Likely contact dermatitis - Make an appointment for patch testing. - This will require a Wednesday and Friday visit.   3. Return in about 10 days (around 06/25/2017).  Please inform us of any Emergency Department visits, hospitalizations, or changes in symptoms. Call us before going to the ED for breathing or allergy symptoms since we might be able to fit you in for a sick visit. Feel free to contact us anytime with any questions, problems, or concerns.    Websites that have reliable patient information: 1. American Academy of Asthma, Allergy, and Immunology: www.aaaai.org 2. Food Allergy Research and Education (FARE): foodallergy.org 3. Mothers of Asthmatics: http://www.asthmacommunitynetwork.org 4. American College of Allergy, Asthma, and Immunology: www.acaai.org

## 2017-06-17 ENCOUNTER — Encounter: Payer: Self-pay | Admitting: Allergy & Immunology

## 2017-07-02 ENCOUNTER — Ambulatory Visit (INDEPENDENT_AMBULATORY_CARE_PROVIDER_SITE_OTHER): Payer: BLUE CROSS/BLUE SHIELD | Admitting: Pediatrics

## 2017-07-02 DIAGNOSIS — L253 Unspecified contact dermatitis due to other chemical products: Secondary | ICD-10-CM

## 2017-07-03 NOTE — Progress Notes (Signed)
  [address] [place] [address] Dept: [phone]  FOLLOW UP NOTE  Patient ID: Angela Adams Fuhs, female    DOB: [birth year]  Age: 52 y.o. MRN: [id number] Date of Office Visit: 07/02/2017  Assessment  Chief Complaint: No chief complaint on file.  HPI Angela Adams Jesus presents for for patch testing   Drug Allergies:  Allergies  Allergen Reactions  . Tetracyclines & Related Hives  . Erythrocin Hives  . Hydrocodone Hives    Physical Exam: There were no vitals taken for this visit.   Physical Exam  Skin:  Clear and ready to have patch testing appled    Diagnostics:  Patch testing applied  Assessment and Plan: 1. Contact dermatitis due to chemicals        Patient Instructions  Patch testing was applied Come  in 48 hours for first reading   Return in about 2 days (around 07/04/2017).    Thank you for the opportunity to care for this patient.  Please do not hesitate to contact me with questions.  Tonette BihariJ. A. Aliviah Spain, M.Adams.  [facility] [address] [place], [place] [address] [phone]

## 2017-07-03 NOTE — Patient Instructions (Signed)
Patch testing was applied Come  in 48 hours for first reading

## 2017-07-04 ENCOUNTER — Encounter: Payer: Self-pay | Admitting: Allergy and Immunology

## 2017-07-04 ENCOUNTER — Ambulatory Visit: Payer: BLUE CROSS/BLUE SHIELD | Admitting: Allergy and Immunology

## 2017-07-04 DIAGNOSIS — L235 Allergic contact dermatitis due to other chemical products: Secondary | ICD-10-CM

## 2017-07-04 NOTE — Assessment & Plan Note (Signed)
   The patient has been provided with written avoidance information for thiuram mix and gold sodium thiosulfate.   Continue to keep back clean and dry.   Follow-up 2 days for final patch test reading/interpretation.

## 2017-07-04 NOTE — Progress Notes (Signed)
    Follow-up Note  RE: Angela Adams MRN: [id number] DOB: June 14, 1965 Date of Office Visit: 07/04/2017  Primary care provider: Wilburn MylarKelly, Samuel S, MD Referring provider: Wilburn MylarKelly, Samuel S, MD   Angela Adams returns to the office today for the initial patch test interpretation, given suspected history of contact dermatitis.    Diagnostics:  TRUE TEST 48 hour reading: borderline positive/equivocal to  thiuram mix and gold sodium thiosulfate.    Plan:   The patient has been provided with written avoidance information for thiuram mix and gold sodium thiosulfate.   Continue to keep back clean and dry.   Follow-up 2 days for final patch test reading/interpretation.

## 2017-07-04 NOTE — Patient Instructions (Addendum)
Contact dermatitis  The patient has been provided with written avoidance information for thiuram mix and gold sodium thiosulfate.   Continue to keep back clean and dry.   Follow-up 2 days for final patch test reading/interpretation.   Return in about 2 days (around 07/06/2017).

## 2017-07-06 ENCOUNTER — Encounter: Payer: BLUE CROSS/BLUE SHIELD | Admitting: Allergy & Immunology

## 2017-07-09 ENCOUNTER — Telehealth: Payer: Self-pay | Admitting: Allergy & Immunology

## 2017-07-09 ENCOUNTER — Ambulatory Visit (INDEPENDENT_AMBULATORY_CARE_PROVIDER_SITE_OTHER): Payer: BLUE CROSS/BLUE SHIELD | Admitting: Pediatrics

## 2017-07-09 DIAGNOSIS — L235 Allergic contact dermatitis due to other chemical products: Secondary | ICD-10-CM | POA: Diagnosis not present

## 2017-07-09 DIAGNOSIS — J3089 Other allergic rhinitis: Principal | ICD-10-CM

## 2017-07-09 DIAGNOSIS — J302 Other seasonal allergic rhinitis: Secondary | ICD-10-CM

## 2017-07-09 NOTE — Telephone Encounter (Signed)
Pt would like to know if she should restart her allergy injections and if so would it be the same recipe? States, she was getting a lot of swelling when she was on shots before. Please advise

## 2017-07-09 NOTE — Telephone Encounter (Signed)
Patient came in this morning for final patch read. Has concern/ question about re-starting injections. Please advise.

## 2017-07-10 ENCOUNTER — Encounter: Payer: Self-pay | Admitting: Pediatrics

## 2017-07-10 NOTE — Telephone Encounter (Signed)
Reviewed the chart. I think she would benefit from restarting her allergy shots. I will reorder the shots so that they are less allergenic.  IT Team - please cancel the previous allergen immunotherapy scripts.   Malachi BondsJoel Keishawna Carranza, MD [facility]

## 2017-07-10 NOTE — Patient Instructions (Addendum)
  Allergic contact dermatitis - The you have been provided detailed information regarding the substances she is sensitive to, as well as products containing the substances.   - Meticulous avoidance of these substances is recommended.  - If avoidance is not possible, the use of barrier creams or lotions is recommended. - If symptoms persist or progress despite meticulous avoidance of Gold Sodium Thiosulfate, Dermatology Referral may be warranted.  Call me if this treatment plan is not working for you.  Follow up in 6 months or sooner if needed

## 2017-07-10 NOTE — Progress Notes (Signed)
    Follow-up Note  RE: Angela Adams MRN: [id number] DOB: [birth year] Date of Office Visit: 07/09/2017  Primary care provider: Wilburn MylarKelly, Samuel S, MD Referring provider: Wilburn MylarKelly, Samuel S, MD   Angela Adams returns to the office today for the final patch test interpretation, given suspected history of contact dermatitis.    Diagnostics:   TRUE TEST 7 day hour reading: Positive reaction to #28 (Gold sodium thiosulfate)  Plan:   Allergic contact dermatitis - The patient has been provided detailed information regarding the substances she is sensitive to, as well as products containing the substances.   - Meticulous avoidance of these substances is recommended.  - If avoidance is not possible, the use of barrier creams or lotions is recommended. - If symptoms persist or progress despite meticulous avoidance of Gold Sodium Thiosulfate, Dermatology Referral may be warranted.  Thermon LeylandAnne Ambs, FNP [facility]  I have provided oversight concerning Thermon Leylandnne Ambs' evaluation and treatment of this patient's health issues addressed during today's encounter. I agree with the assessment and therapeutic plan as outlined in the note.

## 2017-07-11 NOTE — Telephone Encounter (Signed)
Spoke with pt she would like time to think about it before restarting new vials. She thought she would be continuing on the old vials.

## 2017-07-12 NOTE — Telephone Encounter (Signed)
Noted. We did make some changes to her script, including removing Johnson grass and moving some of the more stable allergens (dust mite and ragweed) to the mold vial so that she would be getting more diluent in each vial. Therefore the vials would need to be remixed and then she would start over.   Malachi BondsJoel Delina Kruczek, MD [facility] [place]

## 2017-07-12 NOTE — Telephone Encounter (Signed)
LMOM for pt to call back if she wanted additional information about why Dr. Dellis AnesGallagher is remixing her vials

## 2017-08-27 ENCOUNTER — Other Ambulatory Visit: Payer: Self-pay

## 2017-08-27 MED ORDER — LEVOCETIRIZINE DIHYDROCHLORIDE 5 MG PO TABS: 5.0000 mg | ORAL_TABLET | Freq: Every evening | ORAL | 5 refills | Status: DC

## 2017-08-27 NOTE — Telephone Encounter (Signed)
RF on levocetirizine 5 mg x 1 with 5 refills at [facility]

## 2018-01-11 ENCOUNTER — Ambulatory Visit (INDEPENDENT_AMBULATORY_CARE_PROVIDER_SITE_OTHER): Payer: BLUE CROSS/BLUE SHIELD | Admitting: Allergy & Immunology

## 2018-01-11 ENCOUNTER — Encounter: Payer: Self-pay | Admitting: Allergy & Immunology

## 2018-01-11 VITALS — BP 126/82 | HR 80 | Temp 97.5°F | Resp 16

## 2018-01-11 DIAGNOSIS — J3089 Other allergic rhinitis: Secondary | ICD-10-CM

## 2018-01-11 DIAGNOSIS — L253 Unspecified contact dermatitis due to other chemical products: Secondary | ICD-10-CM | POA: Diagnosis not present

## 2018-01-11 DIAGNOSIS — J302 Other seasonal allergic rhinitis: Secondary | ICD-10-CM | POA: Diagnosis not present

## 2018-01-11 MED ORDER — LEVOCETIRIZINE DIHYDROCHLORIDE 5 MG PO TABS: 5.0000 mg | ORAL_TABLET | Freq: Every evening | ORAL | 5 refills | Status: DC

## 2018-01-11 MED ORDER — DESONIDE 0.05 % EX OINT: TOPICAL_OINTMENT | CUTANEOUS | 5 refills | Status: AC

## 2018-01-11 MED ORDER — MONTELUKAST SODIUM 10 MG PO TABS: 10.0000 mg | ORAL_TABLET | Freq: Every day | ORAL | 5 refills | Status: DC

## 2018-01-11 NOTE — Patient Instructions (Addendum)
1. Chronic allergic rhinitis (grasses, weeds, trees, molds, dust mite, and cat)  - Continue with fluticasone two sprays per nostril daily. - Continue with montelukast 10mg  daily.  - Continue Xyzal 5mg  daily. - Continue with Allexgra fexofenadine 180mg  in the morning. - Make an appointment in two weeks to start your injections with the less allergenic prescription.   2. Return in about 2 weeks (around 01/25/2018) for START INJECTIONS.  Please inform us of any Emergency Department visits, hospitalizations, or changes in symptoms. Call us before going to the ED for breathing or allergy symptoms since we might be able to fit you in for a sick visit. Feel free to contact us anytime with any questions, problems, or concerns.    Websites that have reliable patient information: 1. American Academy of Asthma, Allergy, and Immunology: www.aaaai.org 2. Food Allergy Research and Education (FARE): foodallergy.org 3. Mothers of Asthmatics: http://www.asthmacommunitynetwork.org 4. American College of Allergy, Asthma, and Immunology: www.acaai.org

## 2018-01-11 NOTE — Progress Notes (Signed)
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 Chronic allergic rhinitis (grasses, weeds, trees, molds, dust mite, and cat)  - Continue with fluticasone two sprays per nostril daily. - Continue with montelukast 10mg  daily.  - Continue Xyzal 5mg  daily. - Continue with Allexgra fexofenadine 180mg  in the morning. - Make an appointment in two weeks to start your injections with the less allergenic prescription.  - We will also dilute down to the Silver Vial and advance on Schedule A.   2. Return in about 2 weeks (around 01/25/2018) for START INJECTIONS.  Subjective:   Angela Adams is a 52 y.o. female presenting today for follow up of  Chief Complaint  Patient presents with  . Allergic Rhinitis     eyes breaking out on top and itchy    Angela Adams has a history of the following: Patient Active Problem List   Diagnosis Date Noted  . Other allergic rhinitis 02/02/2017  . Contact dermatitis 02/02/2017  . Non-seasonal allergic rhinitis due to pollen 09/08/2016    History obtained from: chart review and patient.  Angela Adams's Primary Care Provider is Wilburn MylarKelly, Samuel S, MD.     Angela Adams is a 52 y.o. female presenting for a follow up visit. She was last seen in March 2019, at which time she was getting patch tested. She had testing that positive to gold and thiuram. Prior to that, she was on allergy shots, which - while working well to control her rhinitis symptoms - seemed to be flaring her eye lid dermatitis. She made the decision to stop the injections and then underwent patch testing. We added on Eucrisa to see if this would help, and this ended up causing intense burning. She was then changed to desonide with good results.   Since the last visit, she has done well. She is  using the desonide 1-2 times daily with good results. She did go through exposures and she doe snot use any gold routinely. She did look at the thiuram sheet and did not think that it pertained to her at all.   She remains on the Xyzal 5mg  and montelukast 10mg . She takes Allegra in the evening. All of this takes the edge off of her rhinitis, but she did feel better when she was on allergy shots. She is interested in restarting the allergy shots, but is nervous about the dermatitis flaring again.   Otherwise, there have been no changes to her past medical history, surgical history, family history, or social history.    Review of Systems: a 14-point review of systems is pertinent for what is mentioned in HPI.  Otherwise, all other systems were negative. Constitutional: negative other than that listed in the HPI Eyes: negative other than that listed in the HPI Ears, nose, mouth, throat, and face: negative other than that listed in the HPI Respiratory: negative other than that listed in the HPI Cardiovascular: negative other than that listed in the HPI Gastrointestinal: negative other than that listed in the HPI Genitourinary: negative other than that listed in the HPI Integument: negative other than that listed in the HPI Hematologic: negative other than that listed in the HPI Musculoskeletal: negative other than that listed in the HPI Neurological: negative other than that listed in the HPI Allergy/Immunologic: negative other than that  listed in the HPI    Objective:   Blood pressure 126/82, pulse 80, temperature (!) 97.5 F (36.4 C), temperature source Oral, resp. rate 16, SpO2 96 %. There is no height or weight on file to calculate BMI.   Physical Exam:  General: Alert, interactive, in no acute distress. Smiling.  Eyes: No conjunctival injection bilaterally, no discharge on the right, no discharge on the left, no Horner-Trantas dots present and allergic shiners present bilaterally.  PERRL bilaterally. EOMI without pain. No photophobia.  Ears: Right TM pearly gray with normal light reflex, Left TM pearly gray with normal light reflex, Right TM intact without perforation and Left TM intact without perforation.  Nose/Throat: External nose within normal limits and septum midline. Turbinates edematous and pale with clear discharge. Posterior oropharynx markedly erythematous with cobblestoning in the posterior oropharynx. Tonsils 2+ without exudates.  Tongue without thrush. Lungs: Clear to auscultation without wheezing, rhonchi or rales. No increased work of breathing. CV: Normal S1/S2. No murmurs. Capillary refill <2 seconds.  Skin: Warm and dry, without lesions or rashes. Neuro:   Grossly intact. No focal deficits appreciated. Responsive to questions.  Diagnostic studies: none     [name], MD  [facility]

## 2018-01-16 NOTE — Progress Notes (Signed)
VIALS EXP 01-17-19 

## 2018-01-17 DIAGNOSIS — J301 Allergic rhinitis due to pollen: Secondary | ICD-10-CM | POA: Diagnosis not present

## 2018-01-17 NOTE — Progress Notes (Signed)
MORE LABELS FOR VIALS

## 2018-01-18 DIAGNOSIS — J3089 Other allergic rhinitis: Secondary | ICD-10-CM

## 2018-02-07 ENCOUNTER — Ambulatory Visit: Payer: Self-pay | Admitting: *Deleted

## 2018-02-07 ENCOUNTER — Ambulatory Visit (INDEPENDENT_AMBULATORY_CARE_PROVIDER_SITE_OTHER): Payer: BLUE CROSS/BLUE SHIELD | Admitting: *Deleted

## 2018-02-07 DIAGNOSIS — J309 Allergic rhinitis, unspecified: Secondary | ICD-10-CM

## 2018-02-07 NOTE — Progress Notes (Signed)
Immunotherapy   Patient Details  Name: Angela Adams MRN: [id number] Date of Birth: Oct 03, 1965  02/07/2018  [name] started injections for  mold-ragweed-dustmite and grass-weed-tree-cat Following schedule: A  Frequency:1 time per week Epi-Pen:Epi-Pen Available  Consent signed and patient instructions given.   [name] 02/07/2018, 4:06 PM

## 2018-02-14 ENCOUNTER — Ambulatory Visit (INDEPENDENT_AMBULATORY_CARE_PROVIDER_SITE_OTHER): Payer: BLUE CROSS/BLUE SHIELD

## 2018-02-14 DIAGNOSIS — J309 Allergic rhinitis, unspecified: Secondary | ICD-10-CM

## 2018-02-20 ENCOUNTER — Ambulatory Visit (INDEPENDENT_AMBULATORY_CARE_PROVIDER_SITE_OTHER): Payer: BLUE CROSS/BLUE SHIELD

## 2018-02-20 DIAGNOSIS — J309 Allergic rhinitis, unspecified: Secondary | ICD-10-CM

## 2018-02-28 ENCOUNTER — Ambulatory Visit (INDEPENDENT_AMBULATORY_CARE_PROVIDER_SITE_OTHER): Payer: BLUE CROSS/BLUE SHIELD

## 2018-02-28 DIAGNOSIS — J309 Allergic rhinitis, unspecified: Secondary | ICD-10-CM

## 2018-03-08 ENCOUNTER — Ambulatory Visit (INDEPENDENT_AMBULATORY_CARE_PROVIDER_SITE_OTHER): Payer: BLUE CROSS/BLUE SHIELD

## 2018-03-08 ENCOUNTER — Other Ambulatory Visit: Payer: Self-pay

## 2018-03-08 DIAGNOSIS — J309 Allergic rhinitis, unspecified: Secondary | ICD-10-CM

## 2018-03-08 MED ORDER — EPINEPHRINE 0.3 MG/0.3ML IJ SOAJ: INTRAMUSCULAR | 3 refills | Status: AC

## 2018-03-08 NOTE — Telephone Encounter (Signed)
RF for Auvi-Q 0.3 sent to [facility]

## 2018-03-15 ENCOUNTER — Ambulatory Visit (INDEPENDENT_AMBULATORY_CARE_PROVIDER_SITE_OTHER): Payer: BLUE CROSS/BLUE SHIELD

## 2018-03-15 DIAGNOSIS — J309 Allergic rhinitis, unspecified: Secondary | ICD-10-CM | POA: Diagnosis not present

## 2018-04-02 ENCOUNTER — Ambulatory Visit (INDEPENDENT_AMBULATORY_CARE_PROVIDER_SITE_OTHER): Payer: BLUE CROSS/BLUE SHIELD

## 2018-04-02 DIAGNOSIS — J309 Allergic rhinitis, unspecified: Secondary | ICD-10-CM | POA: Diagnosis not present

## 2018-10-02 ENCOUNTER — Other Ambulatory Visit: Payer: Self-pay | Admitting: *Deleted

## 2018-10-02 MED ORDER — LEVOCETIRIZINE DIHYDROCHLORIDE 5 MG PO TABS: 5.0000 mg | ORAL_TABLET | Freq: Every evening | ORAL | 0 refills | Status: AC

## 2019-01-13 ENCOUNTER — Other Ambulatory Visit: Payer: Self-pay

## 2019-01-13 DIAGNOSIS — Z20822 Contact with and (suspected) exposure to covid-19: Secondary | ICD-10-CM

## 2019-01-14 LAB — NOVEL CORONAVIRUS, NAA: SARS-CoV-2, NAA: NOT DETECTED

## 2019-04-19 ENCOUNTER — Encounter (HOSPITAL_BASED_OUTPATIENT_CLINIC_OR_DEPARTMENT_OTHER): Payer: Self-pay | Admitting: Emergency Medicine

## 2019-04-19 ENCOUNTER — Emergency Department (HOSPITAL_BASED_OUTPATIENT_CLINIC_OR_DEPARTMENT_OTHER)
Admission: EM | Admit: 2019-04-19 | Discharge: 2019-04-19 | Disposition: A | Discharge status: Home / Self Care | Payer: BC Managed Care – PPO | Attending: Emergency Medicine | Admitting: Emergency Medicine

## 2019-04-19 ENCOUNTER — Other Ambulatory Visit: Payer: Self-pay

## 2019-04-19 ENCOUNTER — Emergency Department (HOSPITAL_BASED_OUTPATIENT_CLINIC_OR_DEPARTMENT_OTHER): Payer: BC Managed Care – PPO

## 2019-04-19 DIAGNOSIS — Z881 Allergy status to other antibiotic agents status: Secondary | ICD-10-CM | POA: Insufficient documentation

## 2019-04-19 DIAGNOSIS — R109 Unspecified abdominal pain: Secondary | ICD-10-CM

## 2019-04-19 DIAGNOSIS — R1011 Right upper quadrant pain: Secondary | ICD-10-CM | POA: Insufficient documentation

## 2019-04-19 DIAGNOSIS — Z885 Allergy status to narcotic agent status: Secondary | ICD-10-CM | POA: Insufficient documentation

## 2019-04-19 DIAGNOSIS — Z79899 Other long term (current) drug therapy: Secondary | ICD-10-CM | POA: Diagnosis not present

## 2019-04-19 DIAGNOSIS — R1013 Epigastric pain: Secondary | ICD-10-CM | POA: Diagnosis present

## 2019-04-19 LAB — CBC
HCT: 35.5 % — ABNORMAL LOW (ref 36.0–46.0)
Hemoglobin: 10.3 g/dL — ABNORMAL LOW (ref 12.0–15.0)
MCH: 21.8 pg — ABNORMAL LOW (ref 26.0–34.0)
MCHC: 29 g/dL — ABNORMAL LOW (ref 30.0–36.0)
MCV: 75.1 fL — ABNORMAL LOW (ref 80.0–100.0)
Platelets: 320 10*3/uL (ref 150–400)
RBC: 4.73 MIL/uL (ref 3.87–5.11)
RDW: 17.6 % — ABNORMAL HIGH (ref 11.5–15.5)
WBC: 5 10*3/uL (ref 4.0–10.5)
nRBC: 0 % (ref 0.0–0.2)

## 2019-04-19 LAB — URINALYSIS, ROUTINE W REFLEX MICROSCOPIC
Bilirubin Urine: NEGATIVE
Glucose, UA: NEGATIVE mg/dL
Hgb urine dipstick: NEGATIVE
Ketones, ur: NEGATIVE mg/dL
Leukocytes,Ua: NEGATIVE
Nitrite: NEGATIVE
Protein, ur: NEGATIVE mg/dL
Specific Gravity, Urine: 1.025 (ref 1.005–1.030)
pH: 6.5 (ref 5.0–8.0)

## 2019-04-19 LAB — LIPASE, BLOOD: Lipase: 26 U/L (ref 11–51)

## 2019-04-19 LAB — COMPREHENSIVE METABOLIC PANEL
ALT: 13 U/L (ref 0–44)
AST: 21 U/L (ref 15–41)
Albumin: 3.7 g/dL (ref 3.5–5.0)
Alkaline Phosphatase: 86 U/L (ref 38–126)
Anion gap: 9 (ref 5–15)
BUN: 14 mg/dL (ref 6–20)
CO2: 23 mmol/L (ref 22–32)
Calcium: 8.9 mg/dL (ref 8.9–10.3)
Chloride: 106 mmol/L (ref 98–111)
Creatinine, Ser: 0.78 mg/dL (ref 0.44–1.00)
GFR calc Af Amer: >60 mL/min (ref >60)
GFR calc non Af Amer: >60 mL/min (ref >60)
Glucose, Bld: 92 mg/dL (ref 70–99)
Potassium: 3.9 mmol/L (ref 3.5–5.1)
Sodium: 138 mmol/L (ref 135–145)
Total Bilirubin: 0.5 mg/dL (ref 0.3–1.2)
Total Protein: 6.5 g/dL (ref 6.5–8.1)

## 2019-04-19 LAB — D-DIMER, QUANTITATIVE: D-Dimer, Quant: 0.77 ug/mL-FEU — ABNORMAL HIGH (ref 0.00–0.50)

## 2019-04-19 LAB — TROPONIN I (HIGH SENSITIVITY)
Troponin I (High Sensitivity): <2 ng/L (ref <18)
Troponin I (High Sensitivity): <2 ng/L (ref <18)

## 2019-04-19 MED ORDER — SUCRALFATE 1 G PO TABS
1.0000 g | ORAL_TABLET | Freq: Once | ORAL | Status: DC
  Filled 2019-04-19: qty 1

## 2019-04-19 MED ORDER — MORPHINE SULFATE (PF) 4 MG/ML IV SOLN
4.0000 mg | Freq: Once | INTRAVENOUS | Status: AC
  Administered 2019-04-19: 4 mg via INTRAVENOUS
  Filled 2019-04-19: qty 1

## 2019-04-19 MED ORDER — HYDROMORPHONE HCL 1 MG/ML IJ SOLN
1.0000 mg | Freq: Once | INTRAMUSCULAR | Status: AC
  Administered 2019-04-19: 1 mg via INTRAVENOUS
  Filled 2019-04-19: qty 1

## 2019-04-19 MED ORDER — ALUM & MAG HYDROXIDE-SIMETH 200-200-20 MG/5ML PO SUSP
30.0000 mL | Freq: Once | ORAL | Status: AC
  Administered 2019-04-19: 30 mL via ORAL
  Filled 2019-04-19: qty 30

## 2019-04-19 MED ORDER — KETOROLAC TROMETHAMINE 30 MG/ML IJ SOLN
30.0000 mg | Freq: Once | INTRAMUSCULAR | Status: AC
  Administered 2019-04-19: 30 mg via INTRAVENOUS
  Filled 2019-04-19: qty 1

## 2019-04-19 MED ORDER — ONDANSETRON HCL 4 MG/2ML IJ SOLN
4.0000 mg | Freq: Once | INTRAMUSCULAR | Status: AC
  Administered 2019-04-19: 4 mg via INTRAVENOUS
  Filled 2019-04-19: qty 2

## 2019-04-19 MED ORDER — SODIUM CHLORIDE 0.9% FLUSH
3.0000 mL | Freq: Once | INTRAVENOUS | Status: AC
  Administered 2019-04-19: 11:00:00 3 mL via INTRAVENOUS
  Filled 2019-04-19: qty 3

## 2019-04-19 MED ORDER — SUCRALFATE 1 GM/10ML PO SUSP: 1.0000 g | Freq: Three times a day (TID) | ORAL | 0 refills | Status: AC

## 2019-04-19 MED ORDER — PANTOPRAZOLE SODIUM 40 MG PO TBEC: 40.0000 mg | DELAYED_RELEASE_TABLET | Freq: Every day | ORAL | 0 refills | Status: DC

## 2019-04-19 MED ORDER — SODIUM CHLORIDE 0.9 % IV BOLUS
1000.0000 mL | Freq: Once | INTRAVENOUS | Status: AC
  Administered 2019-04-19: 11:00:00 1000 mL via INTRAVENOUS

## 2019-04-19 MED ORDER — IOHEXOL 300 MG/ML  SOLN
100.0000 mL | Freq: Once | INTRAMUSCULAR | Status: AC | PRN
  Administered 2019-04-19: 100 mL via INTRAVENOUS

## 2019-04-19 MED ORDER — SUCRALFATE 1 GM/10ML PO SUSP
ORAL | Status: AC
  Administered 2019-04-19: 1 g
  Filled 2019-04-19: qty 10

## 2019-04-19 MED ORDER — IOHEXOL 350 MG/ML SOLN
50.0000 mL | Freq: Once | INTRAVENOUS | Status: AC | PRN
  Administered 2019-04-19: 50 mL via INTRAVENOUS

## 2019-04-19 MED ORDER — FAMOTIDINE IN NACL 20-0.9 MG/50ML-% IV SOLN
20.0000 mg | Freq: Once | INTRAVENOUS | Status: AC
  Administered 2019-04-19: 20 mg via INTRAVENOUS
  Filled 2019-04-19: qty 50

## 2019-04-19 MED ORDER — PANTOPRAZOLE SODIUM 40 MG IV SOLR
40.0000 mg | Freq: Once | INTRAVENOUS | Status: AC
  Administered 2019-04-19: 40 mg via INTRAVENOUS
  Filled 2019-04-19: qty 40

## 2019-04-19 NOTE — ED Notes (Signed)
Patient transported to CT 

## 2019-04-19 NOTE — ED Provider Notes (Addendum)
[facility] EMERGENCY DEPARTMENT Provider Note   CSN: [id number] Arrival date & time: 04/19/19  1011     History Chief Complaint  Patient presents with  . Abdominal Pain    Angela Adams is a 53 y.o. female.  HPI   Pt is a 53 y/o female with a h/o cholecystectomy, hysterectomy, who presents to the ED today for eval of epigastric abd pain that radiates to the back. This has been ongoing intermittently for the last 2 months. States that over the last week it has been worse and since 3am this morning it has been constant. Pain is worse with certain foods. Pain is worse when laying flat. Pain rated 8/10. She has not tried any medication for her sxs. Denies fevers, diarrhea, constipation, chest pain. She reports nausea, vomiting. She has shortness of breath only when the pain is very severe. States pain feels similar to when she had to have her gallbladder removed.   She heavy NSAID use. She states she has one glass of wine a few times per week.   Past Medical History:  Diagnosis Date  . Bulging lumbar disc    L1 L2  . Protrusion of lumbar intervertebral disc    L3 L4    Patient Active Problem List   Diagnosis Date Noted  . Other allergic rhinitis 02/02/2017  . Contact dermatitis 02/02/2017  . Non-seasonal allergic rhinitis due to pollen 09/08/2016    Past Surgical History:  Procedure Laterality Date  . ABDOMINAL HYSTERECTOMY    . ADENOIDECTOMY    . CHOLECYSTECTOMY    . complete hysterectomy    . EPIDURAL BLOCK INJECTION     L5 S1  . GALLBLADDER SURGERY    . TONSILLECTOMY    . TYMPANOSTOMY TUBE PLACEMENT       OB History   No obstetric history on file.     Family History  Problem Relation Age of Onset  . Allergic rhinitis Neg Hx   . Angioedema Neg Hx   . Asthma Neg Hx   . Eczema Neg Hx   . Immunodeficiency Neg Hx   . Urticaria Neg Hx     Social History   Tobacco Use  . Smoking status: Never Smoker  . Smokeless tobacco: Never Used    Substance Use Topics  . Alcohol use: Yes    Comment: occ  . Drug use: No    Home Medications Prior to Admission medications   Medication Sig Start Date End Date Taking? Authorizing Provider  amphetamine-dextroamphetamine (ADDERALL XR) 30 MG 24 hr capsule Take 30 mg by mouth daily. 10/10/16   [provider]  azelastine (ASTELIN) 0.1 % nasal spray Place 2 sprays into both nostrils 2 (two) times daily. 01/17/16 01/16/17  [provider]  B Complex Vitamins (VITAMIN B COMPLEX PO) Take by mouth.    [provider]  Cholecalciferol (VITAMIN D3) 3000 units TABS Take by mouth.    [provider]  Crisaborole (EUCRISA) 2 % OINT Apply 1 application topically 2 (two) times daily as needed. Patient not taking: Reported on 01/11/2018 10/30/16   Alfonse Spruce, MD  desonide (DESOWEN) 0.05 % ointment Apply twice daily as needed for red itchy areas. 01/11/18   Alfonse Spruce, MD  diazepam (VALIUM) 5 MG tablet TK 1 T PO NIGHTLY PRF MSP FOR UP TO 14 DAYS. 05/29/17   [provider]  EPINEPHrine (AUVI-Q) 0.3 mg/0.3 mL IJ SOAJ injection Use as directed for severe allergic reactions 03/08/18  Alfonse Spruce, MD  estradiol (ESTRACE) 1 MG tablet Take 1 mg by mouth daily. 09/07/15   [provider]  fexofenadine (ALLEGRA) 180 MG tablet Take 180 mg by mouth daily.    [provider]  FIBER PO Take by mouth.    [provider]  fluticasone (FLONASE) 50 MCG/ACT nasal spray Place 2 sprays into both nostrils 2 (two) times daily. 10/25/15 12/08/16  [provider]  gabapentin (NEURONTIN) 300 MG capsule  05/29/17   [provider]  Garlic 100 MG TABS Take by mouth.    [provider]  levocetirizine (XYZAL) 5 MG tablet Take 1 tablet (5 mg total) by mouth every evening. 10/02/18   Alfonse Spruce, MD  montelukast (SINGULAIR) 10 MG tablet Take 1 tablet (10 mg total) by mouth at bedtime. 01/11/18   Alfonse Spruce, MD  Multiple Vitamins-Minerals (ZINC PO) Take by mouth.    [provider]  Omega-3 Fatty Acids (FISH OIL) 1000 MG CAPS Take 1 tablet by mouth 2 (two) times daily.    [provider]  pantoprazole (PROTONIX) 40 MG tablet Take 1 tablet (40 mg total) by mouth daily for 14 days. 04/19/19 05/03/19  Ishmeal Rorie S, PA-C  sucralfate (CARAFATE) 1 GM/10ML suspension Take 10 mLs (1 g total) by mouth 4 (four) times daily -  with meals and at bedtime. 04/19/19   Terreon Ekholm S, PA-C  vitamin A 09811 UNIT capsule Take 10,000 Units by mouth daily.    [provider]  vitamin C (ASCORBIC ACID) 500 MG tablet Take 500 mg by mouth daily.    [provider]  vitamin E 1000 UNIT capsule Take 1,000 Units by mouth daily.    [provider]    Allergies    Tetracyclines & related, Erythrocin, and Hydrocodone  Review of Systems   Review of Systems  Constitutional: Negative for chills and fever.  HENT: Negative for ear pain and sore throat.   Eyes: Negative for visual disturbance.  Respiratory: Positive for shortness of breath. Negative for cough.   Cardiovascular: Negative for chest pain.  Gastrointestinal: Positive for abdominal pain, nausea and vomiting. Negative for constipation and diarrhea.  Genitourinary: Negative for dysuria and hematuria.  Musculoskeletal: Negative for back pain.  Skin: Negative for rash.  Neurological: Negative for headaches.  All other systems reviewed and are negative.   Physical Exam Updated Vital Signs BP (!) 141/85 (BP Location: Right Arm)   Pulse 77   Temp 98.5 F (36.9 C) (Oral)   Resp 16   Ht  (1.702 m)   Wt 70.8 kg   SpO2 100%   BMI 24.43 kg/m   Physical Exam Vitals and nursing note reviewed.  Constitutional:      General: She is not in acute distress.    Appearance: She is well-developed. She is not ill-appearing.  HENT:     Head: Normocephalic and atraumatic.  Eyes:     Conjunctiva/sclera:  Conjunctivae normal.  Cardiovascular:     Rate and Rhythm: Normal rate and regular rhythm.     Heart sounds: No murmur.  Pulmonary:     Effort: Pulmonary effort is normal. No respiratory distress.     Breath sounds: Normal breath sounds. No wheezing, rhonchi or rales.  Abdominal:     Palpations: Abdomen is soft.     Tenderness: There is abdominal tenderness in the right upper quadrant and epigastric area. There is no right CVA tenderness, left CVA tenderness, guarding or  rebound.  Musculoskeletal:     Cervical back: Neck supple.  Skin:    General: Skin is warm and dry.  Neurological:     Mental Status: She is alert.     ED Results / Procedures / Treatments   Labs (all labs ordered are listed, but only abnormal results are displayed) Labs Reviewed  CBC - Abnormal; Notable for the following components:      Result Value   Hemoglobin 10.3 (*)    HCT 35.5 (*)    MCV 75.1 (*)    MCH 21.8 (*)    MCHC 29.0 (*)    RDW 17.6 (*)    All other components within normal limits  D-DIMER, QUANTITATIVE (NOT AT [facility]) - Abnormal; Notable for the following components:   D-Dimer, Quant 0.77 (*)    All other components within normal limits  LIPASE, BLOOD  COMPREHENSIVE METABOLIC PANEL  URINALYSIS, ROUTINE W REFLEX MICROSCOPIC  TROPONIN I (HIGH SENSITIVITY)  TROPONIN I (HIGH SENSITIVITY)    EKG EKG Interpretation  Date/Time:  Saturday April 19 2019 10:19:57 EST Ventricular Rate:  80 PR Interval:  134 QRS Duration: 74 QT Interval:  396 QTC Calculation: 456 R Axis:   78 Text Interpretation: Normal sinus rhythm Normal ECG Confirmed by Quintella Reichert [phone]) on 04/19/2019 11:02:12 AM   Radiology CT Angio Chest PE W and/or Wo Contrast  Result Date: 04/19/2019 CLINICAL DATA:  Epigastric pain radiating into back. EXAM: CT ANGIOGRAPHY CHEST WITH CONTRAST TECHNIQUE: Multidetector CT imaging of the chest was performed using the standard protocol during bolus administration of  intravenous contrast. Multiplanar CT image reconstructions and MIPs were obtained to evaluate the vascular anatomy. CONTRAST:  3mL OMNIPAQUE IOHEXOL 350 MG/ML SOLN COMPARISON:  None. FINDINGS: Cardiovascular: The pulmonary arteries are well opacified. There is no evidence of acute pulmonary embolism. Central pulmonary arteries are normal in caliber. The thoracic aorta is also well opacified and demonstrates normal caliber without evidence of aneurysmal disease, atherosclerosis or dissection. The heart size is normal. No pericardial fluid identified. No visualized calcified plaque in the distribution of the coronary arteries. Mediastinum/Nodes: No enlarged mediastinal, hilar, or axillary lymph nodes. Thyroid gland, trachea, and esophagus demonstrate no significant findings. Lungs/Pleura: There is no evidence of pulmonary edema, consolidation, pneumothorax, nodule or pleural fluid. Upper Abdomen: No acute abnormality. Musculoskeletal: No chest wall abnormality. No acute or significant osseous findings. Review of the MIP images confirms the above findings. IMPRESSION: Unremarkable CTA of the chest demonstrating no evidence of acute pulmonary embolism or other acute findings. Electronically Signed   By: Aletta Edouard M.D.   On: 04/19/2019 15:25   CT ABDOMEN PELVIS W CONTRAST  Result Date: 04/19/2019 CLINICAL DATA:  Epigastric abdominal pain EXAM: CT ABDOMEN AND PELVIS WITH CONTRAST TECHNIQUE: Multidetector CT imaging of the abdomen and pelvis was performed using the standard protocol following bolus administration of intravenous contrast. CONTRAST:  152mL OMNIPAQUE IOHEXOL 300 MG/ML  SOLN COMPARISON:  None. FINDINGS: Lower chest:  No contributory findings. Hepatobiliary: No focal liver abnormality.Cholecystectomy. No bile duct dilatation. Pancreas: Unremarkable. Spleen: Unremarkable. Adrenals/Urinary Tract: Negative adrenals. No hydronephrosis or ureteral stone. Calcification along the upper right ureter is an  ovarian vein phleboliths. A punctate interpolar calculus is seen on the right on coronal reformats. Unremarkable bladder. Stomach/Bowel:  No obstruction. No appendicitis. Vascular/Lymphatic: No acute vascular abnormality. No mass or adenopathy. Reproductive:Hysterectomy. Other: No ascites or pneumoperitoneum. Musculoskeletal: No acute abnormalities.  Lumbar levocurvature IMPRESSION: 1. No acute finding or explanation for abdominal pain. 2. Cholecystectomy. 3.  Tiny right renal calculus. Electronically Signed   By: Marnee Spring M.D.   On: 04/19/2019 11:57    Procedures Procedures (including critical care time)  Medications Ordered in ED Medications  sucralfate (CARAFATE) tablet 1 g (1 g Oral Not Given 04/19/19 1249)  sodium chloride flush (NS) 0.9 % injection 3 mL (3 mLs Intravenous Given 04/19/19 1053)  sodium chloride 0.9 % bolus 1,000 mL (0 mLs Intravenous Stopped 04/19/19 1238)  famotidine (PEPCID) IVPB 20 mg premix (0 mg Intravenous Stopped 04/19/19 1238)  alum & mag hydroxide-simeth (MAALOX/MYLANTA) 200-200-20 MG/5ML suspension 30 mL (30 mLs Oral Given 04/19/19 1123)  ondansetron (ZOFRAN) injection 4 mg (4 mg Intravenous Given 04/19/19 1123)  ketorolac (TORADOL) 30 MG/ML injection 30 mg (30 mg Intravenous Given 04/19/19 1122)  iohexol (OMNIPAQUE) 300 MG/ML solution 100 mL (100 mLs Intravenous Contrast Given 04/19/19 1137)  pantoprazole (PROTONIX) injection 40 mg (40 mg Intravenous Given 04/19/19 1248)  morphine 4 MG/ML injection 4 mg (4 mg Intravenous Given 04/19/19 1244)  sucralfate (CARAFATE) 1 GM/10ML suspension (1 g  Given 04/19/19 1248)  HYDROmorphone (DILAUDID) injection 1 mg (1 mg Intravenous Given 04/19/19 1411)  iohexol (OMNIPAQUE) 350 MG/ML injection 50 mL (50 mLs Intravenous Contrast Given 04/19/19 1506)    ED Course  I have reviewed the triage vital signs and the nursing notes.  Pertinent labs & imaging results that were available during my care of the patient were  reviewed by me and considered in my medical decision making (see chart for details).    MDM Rules/Calculators/A&P                     53 year old female status post cholecystectomy presenting for evaluation of epigastric pain radiating to the back for several months but worsening over the last week.  Seems to worse with food.  She is had some associated nausea and vomiting as well.  CBC without leukocytosis, mild anemia present. CMP without gross electrolyte derangement.  Normal kidney and liver function Lipase neg UA neg Delta trop neg -sxs are not typical for ACS, very much doubt this Ddimer elevated  ekg nonischemic  CT abd/pelvis neg for acute findings or obvious cause of pain CT chest neg for PE or other abnormality  12:32 PM Reassessed pt. She states she has had some improvement but sxs are not completed resolved. Will give additional medications.   1:47 PM Reassessed pt. She is still c/o epigastric abd pain and lower chest pain.   Pt reassessed and reports some improvement after Dilaudid.  I discussed results of CT chest/abdomen and pelvis as well as laboratory testing.  All of which has been very reassuring.  I suspect that her symptoms may be related to PUD versus gastritis.  I will discharge her with PPI and Carafate and also give her information to follow-up with GI as she may need an endoscopy.  Advised her to monitor her symptoms and if symptoms worsen she may need to return to the ED for new or worsening symptoms.  She voices understanding of plan and reasons to return.  All questions answered.  Patient is stable for discharge.   Final Clinical Impression(s) / ED Diagnoses Final diagnoses:  RUQ pain  Abdominal pain, unspecified abdominal location    Rx / DC Orders ED Discharge Orders         Ordered    pantoprazole (PROTONIX) 40 MG tablet  Daily     04/19/19 1601    sucralfate (CARAFATE) 1 GM/10ML suspension  3 times daily with meals & bedtime     04/19/19 1601             Riccardo Holeman S, PA-C 04/19/19 1601    Karrie MeresCouture, Caira Poche S, PA-C 04/19/19 1603    Tilden Fossaees, Elizabeth, MD 04/20/19 [phone]

## 2019-04-19 NOTE — Discharge Instructions (Signed)
Take medications as prescribed.   Please follow up with GI doctor that was provided in your discharge paperwork within the next 5-7 days.  Please return to the ER sooner if you have any new or worsening symptoms, or if you have any of the following symptoms:  Abdominal pain that does not go away.  You have a fever.  You keep throwing up (vomiting).  The pain is felt only in portions of the abdomen. Pain in the right side could possibly be appendicitis. In an adult, pain in the left lower portion of the abdomen could be colitis or diverticulitis.  You pass bloody or black tarry stools.  There is bright red blood in the stool.  The constipation stays for more than 4 days.  There is belly (abdominal) or rectal pain.  You do not seem to be getting better.  You have any questions or concerns.

## 2019-04-19 NOTE — ED Triage Notes (Signed)
Epigastric pain radiating into her back. States it feel like when she had cholecystitis. She did have her gallbladder removed. Sent from UC. Also endorses nausea.

## 2019-04-21 ENCOUNTER — Ambulatory Visit: Payer: BC Managed Care – PPO | Attending: Internal Medicine

## 2019-04-21 DIAGNOSIS — U071 COVID-19: Secondary | ICD-10-CM

## 2019-04-22 LAB — NOVEL CORONAVIRUS, NAA: SARS-CoV-2, NAA: NOT DETECTED

## 2019-05-07 ENCOUNTER — Other Ambulatory Visit: Payer: Self-pay

## 2019-05-07 ENCOUNTER — Encounter: Payer: Self-pay | Admitting: Gastroenterology

## 2019-05-07 ENCOUNTER — Ambulatory Visit (INDEPENDENT_AMBULATORY_CARE_PROVIDER_SITE_OTHER): Payer: BC Managed Care – PPO | Admitting: Gastroenterology

## 2019-05-07 VITALS — BP 126/80 | HR 90 | Temp 98.1°F | Ht 67.0 in | Wt 159.5 lb

## 2019-05-07 DIAGNOSIS — Z1159 Encounter for screening for other viral diseases: Secondary | ICD-10-CM | POA: Diagnosis not present

## 2019-05-07 DIAGNOSIS — Z8371 Family history of colonic polyps: Secondary | ICD-10-CM

## 2019-05-07 DIAGNOSIS — Z86010 Personal history of colonic polyps: Secondary | ICD-10-CM

## 2019-05-07 DIAGNOSIS — R110 Nausea: Secondary | ICD-10-CM | POA: Diagnosis not present

## 2019-05-07 DIAGNOSIS — D509 Iron deficiency anemia, unspecified: Secondary | ICD-10-CM

## 2019-05-07 DIAGNOSIS — R1013 Epigastric pain: Secondary | ICD-10-CM

## 2019-05-07 MED ORDER — OMEPRAZOLE 40 MG PO CPDR: 40.0000 mg | DELAYED_RELEASE_CAPSULE | Freq: Every day | ORAL | 3 refills | Status: DC

## 2019-05-07 NOTE — Addendum Note (Signed)
Addended by: Barbaraann Rondo A on: 05/07/2019 10:53 AM   Modules accepted: Orders

## 2019-05-07 NOTE — Patient Instructions (Signed)
You have been scheduled for an endoscopy. Please follow written instructions given to you at your visit today. If you use inhalers (even only as needed), please bring them with you on the day of your procedure. Your physician has requested that you go to www.startemmi.com and enter the access code given to you at your visit today. This web site gives a general overview about your procedure. However, you should still follow specific instructions given to you by our office regarding your preparation for the procedure.  Your provider has requested that you go to the basement level for lab work at [address] [place] in [place] [address]. Press "B" on the elevator. The lab is located at the first door on the left as you exit the elevator.  We have sent the following medications to your pharmacy for you to pick up at your convenience:  It was a pleasure to see you today!  Vito Cirigliano, D.O.

## 2019-05-07 NOTE — Progress Notes (Addendum)
Chief Complaint: Epigastric pain  Referring Provider:     [facility]   HPI:    Angela Adams is a 54 y.o. female with a history of ccy 2016, hysterectomy, referred to the Gastroenterology Clinic for evaluation of epigastric pain.  Was seen in the [facility] on 04/19/2019 with c/o epigastric pain radiating to the back x2 months, worsening over the preceding week or so.  Pain worse with foods and supine.  Evaluation notable for H/H 10.3/35.5 (MCV/RDW 75/17.6), normal WBC/PLT, lipase, CMP, UA.  Covid negative.  CT abdomen/pelvis normal.  D-dimer mildly elevated.  CTA negative for PE.  Discharged home with PPI and Carafate.  Today she states she continues to have MEG pain radiating to the back. Sxs similar to when she had GB issues, which had resolved with ccy in 2016, but recurred more recently as above. Associated nausea without emesis. No fever, chills, melena, hematochezia. Sxs worse with "acidic foods", tomato based foods, typically within 60 mins or so. Also worse with supine. Stopped taking PPI when she recently fractured ankle.   No hx of HB, regurgitation, dysphagia.  Does report prolonged history of NSAID use for headaches, taking Motrin or Excedrin 2-3 times/week for headaches.  Previously seen in the [facility] GI clinic in 03/2017 for CRC screening.  Family history of colon polyps in both mother and father, but no family history of CRC.  Reviewed labs from outside facilities: CBC in 10/2017 with H/H 14.5/43.2, MCV/RDW 87.2/13.5.  Vitamin D 31 in 04/2019.  Endoscopic history: -Colonoscopy (01/2012, [facility]): Internal hemorrhoids, otherwise normal.  Biopsies negative for MC.  Repeat in 5 years due to family history -Colonoscopy (03/2017, [facility]): No report for review, but recommended repeat in 5 years   Past Medical History:  Diagnosis Date  . Arthritis   . Bulging lumbar disc    L1 L2  . History of colon polyps    polyp was precancerous  . Kidney stone   . Protrusion of  lumbar intervertebral disc    L3 L4     Past Surgical History:  Procedure Laterality Date  . ABDOMINAL HYSTERECTOMY    . ADENOIDECTOMY    . CHOLECYSTECTOMY    . COLONOSCOPY  2018   [facility]   . complete hysterectomy    . EPIDURAL BLOCK INJECTION     L5 S1  . GALLBLADDER SURGERY    . TONSILLECTOMY    . TYMPANOSTOMY TUBE PLACEMENT     Family History  Problem Relation Age of Onset  . Liver cancer Mother   . Lung cancer Mother   . Colon polyps Mother   . Skin cancer Father   . Colon polyps Father        precancerous  . Breast cancer Maternal Grandmother   . Lung cancer Maternal Grandfather   . Allergic rhinitis Neg Hx   . Angioedema Neg Hx   . Asthma Neg Hx   . Eczema Neg Hx   . Immunodeficiency Neg Hx   . Urticaria Neg Hx   . Colon cancer Neg Hx   . Esophageal cancer Neg Hx    Social History   Tobacco Use  . Smoking status: Never Smoker  . Smokeless tobacco: Never Used  Substance Use Topics  . Alcohol use: Yes    Comment: occ  . Drug use: No   Current Outpatient Medications  Medication Sig Dispense Refill  . amphetamine-dextroamphetamine (ADDERALL XR) 30  MG 24 hr capsule Take 30 mg by mouth daily.    Marland Kitchen azelastine (ASTELIN) 0.1 % nasal spray Place 2 sprays into both nostrils as needed.     . B Complex Vitamins (VITAMIN B COMPLEX PO) Take 1 tablet by mouth daily.     Marland Kitchen desonide (DESOWEN) 0.05 % ointment Apply twice daily as needed for red itchy areas. (Patient taking differently: Apply 1 application topically daily. Apply twice daily as needed for red itchy areas.) 15 g 5  . estradiol (ESTRACE) 1 MG tablet Take 1 mg by mouth daily.    . fexofenadine (ALLEGRA) 180 MG tablet Take 180 mg by mouth daily.    Marland Kitchen FIBER PO Take 1 tablet by mouth daily.     . Garlic 825 MG TABS Take by mouth daily.     Marland Kitchen levocetirizine (XYZAL) 5 MG tablet Take 1 tablet (5 mg total) by mouth every evening. 30 tablet 0  . montelukast (SINGULAIR) 10 MG tablet Take 1 tablet (10 mg total) by  mouth at bedtime. 30 tablet 5  . Multiple Vitamins-Minerals (ZINC PO) Take by mouth.    . Omega-3 Fatty Acids (FISH OIL) 1000 MG CAPS Take 1 tablet by mouth 2 (two) times daily.    . sucralfate (CARAFATE) 1 GM/10ML suspension Take 10 mLs (1 g total) by mouth 4 (four) times daily -  with meals and at bedtime. 420 mL 0  . vitamin A 10000 UNIT capsule Take 10,000 Units by mouth daily.    . vitamin C (ASCORBIC ACID) 500 MG tablet Take 500 mg by mouth daily.    . vitamin E 1000 UNIT capsule Take 1,000 Units by mouth daily.    . Cholecalciferol (VITAMIN D3) 3000 units TABS Take by mouth.    . EPINEPHrine (AUVI-Q) 0.3 mg/0.3 mL IJ SOAJ injection Use as directed for severe allergic reactions (Patient not taking: Reported on 05/07/2019) 2 Device 3  . fluticasone (FLONASE) 50 MCG/ACT nasal spray Place 2 sprays into both nostrils as needed.      No current facility-administered medications for this visit.   Allergies  Allergen Reactions  . Tetracyclines & Related Hives  . Erythrocin Hives  . Hydrocodone Hives     Review of Systems: All systems reviewed and negative except where noted in HPI.     Physical Exam:    Wt Readings from Last 3 Encounters:  05/07/19 159 lb 8 oz (72.3 kg)  04/19/19 156 lb (70.8 kg)  02/02/17 139 lb (63 kg)    BP 126/80   Pulse 90   Temp 98.1 F (36.7 C)   Ht 5\' 7"  (1.702 m)   Wt 159 lb 8 oz (72.3 kg) Comment: with boot  BMI 24.98 kg/m  Constitutional:  Pleasant, in no acute distress. Psychiatric: Normal mood and affect. Behavior is normal. EENT: Pupils normal.  Conjunctivae are normal. No scleral icterus. Neck supple. No cervical LAD. Cardiovascular: Normal rate, regular rhythm. No edema Pulmonary/chest: Effort normal and breath sounds normal. No wheezing, rales or rhonchi. Abdominal: Soft, nondistended, nontender. Bowel sounds active throughout. There are no masses palpable. No hepatomegaly. Neurological: Alert and oriented to person place and  time. Skin: Skin is warm and dry. No rashes noted.   ASSESSMENT AND PLAN;   1) Epigastric pain 2) Microcytic anemia 3) Nausea without vomiting  -EGD to evaluate for PUD, gastritis, occult blood loss, atypical/silent reflux changes, etc. -Check iron panel -Restart Prilosec 40 mg/day -Reviewed prior labs.  Microcytic anemia new from 2019.  4) History of colon polyps 5) Family history of colon polyps -Colonoscopy in 2018 reportedly notable for precancerous polyps per patient.  Recommended repeat in 5 years.  Full report not available in EMR, but can see recommendation for repeat in 5 years. -Repeat colonoscopy in 2023 -If confirmed iron deficiency on labs above, but EGD unrevealing, may consider modifying to repeat colonoscopy now  The indications, risks, and benefits of EGD were explained to the patient in detail. Risks include but are not limited to bleeding, perforation, adverse reaction to medications, and cardiopulmonary compromise. Sequelae include but are not limited to the possibility of surgery, hositalization, and mortality. The patient verbalized understanding and wished to proceed. All questions answered, referred to scheduler. Further recommendations pending results of the exam.    Shellia Cleverly, DO, FACG  05/07/2019, 9:08 AM   Wilburn Mylar, MD  Addendum: Received colonoscopy report dated 04/26/2017 from [facility] Devona Konig, MD).  Excellent bowel prep.  Single 5 mm tubular adenoma resected piecemeal with cold forceps.  Grade 1 internal hemorrhoids.  Recommended repeat in 5 years (2023).

## 2019-05-07 NOTE — Addendum Note (Signed)
Addended by: Shellia Cleverly on: 05/07/2019 10:11 AM   Modules accepted: Level of Service

## 2019-05-12 ENCOUNTER — Ambulatory Visit (INDEPENDENT_AMBULATORY_CARE_PROVIDER_SITE_OTHER): Payer: BC Managed Care – PPO

## 2019-05-12 ENCOUNTER — Other Ambulatory Visit (INDEPENDENT_AMBULATORY_CARE_PROVIDER_SITE_OTHER): Payer: BC Managed Care – PPO

## 2019-05-12 ENCOUNTER — Other Ambulatory Visit: Payer: Self-pay | Admitting: Gastroenterology

## 2019-05-12 DIAGNOSIS — R110 Nausea: Secondary | ICD-10-CM | POA: Diagnosis not present

## 2019-05-12 DIAGNOSIS — D509 Iron deficiency anemia, unspecified: Secondary | ICD-10-CM

## 2019-05-12 DIAGNOSIS — Z86010 Personal history of colonic polyps: Secondary | ICD-10-CM

## 2019-05-12 DIAGNOSIS — Z1159 Encounter for screening for other viral diseases: Secondary | ICD-10-CM | POA: Diagnosis not present

## 2019-05-12 DIAGNOSIS — Z8371 Family history of colonic polyps: Secondary | ICD-10-CM

## 2019-05-12 DIAGNOSIS — R1013 Epigastric pain: Secondary | ICD-10-CM | POA: Diagnosis not present

## 2019-05-12 LAB — SARS CORONAVIRUS 2 (TAT 6-24 HRS): SARS Coronavirus 2: NEGATIVE

## 2019-05-12 LAB — IBC + FERRITIN
Ferritin: 5.5 ng/mL — ABNORMAL LOW (ref 10.0–291.0)
Iron: 28 ug/dL — ABNORMAL LOW (ref 42–145)
Saturation Ratios: 5.5 % — ABNORMAL LOW (ref 20.0–50.0)
Transferrin: 362 mg/dL — ABNORMAL HIGH (ref 212.0–360.0)

## 2019-05-14 ENCOUNTER — Other Ambulatory Visit: Payer: Self-pay | Admitting: Gastroenterology

## 2019-05-14 DIAGNOSIS — D509 Iron deficiency anemia, unspecified: Secondary | ICD-10-CM

## 2019-05-14 NOTE — Progress Notes (Signed)
Spoke to patient to inform her of recent lab results. She states that she has been taking a slow release iron supplement daily for a few months,does not remember the dose and forgot to mention during  her last office visit. She has EGD on 05/15/19. She will repeat labs in 3 months. Orders placed in Epic

## 2019-05-15 ENCOUNTER — Other Ambulatory Visit: Payer: Self-pay

## 2019-05-15 ENCOUNTER — Ambulatory Visit (AMBULATORY_SURGERY_CENTER): Payer: BC Managed Care – PPO | Admitting: Gastroenterology

## 2019-05-15 ENCOUNTER — Telehealth: Payer: Self-pay | Admitting: Internal Medicine

## 2019-05-15 ENCOUNTER — Encounter: Payer: Self-pay | Admitting: Gastroenterology

## 2019-05-15 VITALS — BP 139/82 | HR 83 | Temp 97.8°F | Resp 17 | Ht 67.0 in | Wt 159.0 lb

## 2019-05-15 DIAGNOSIS — R1013 Epigastric pain: Secondary | ICD-10-CM

## 2019-05-15 DIAGNOSIS — K2970 Gastritis, unspecified, without bleeding: Secondary | ICD-10-CM | POA: Diagnosis not present

## 2019-05-15 DIAGNOSIS — K259 Gastric ulcer, unspecified as acute or chronic, without hemorrhage or perforation: Secondary | ICD-10-CM | POA: Diagnosis not present

## 2019-05-15 DIAGNOSIS — K449 Diaphragmatic hernia without obstruction or gangrene: Secondary | ICD-10-CM | POA: Diagnosis not present

## 2019-05-15 DIAGNOSIS — D509 Iron deficiency anemia, unspecified: Secondary | ICD-10-CM

## 2019-05-15 DIAGNOSIS — K3189 Other diseases of stomach and duodenum: Secondary | ICD-10-CM | POA: Diagnosis not present

## 2019-05-15 MED ORDER — OMEPRAZOLE 40 MG PO CPDR: 40.0000 mg | DELAYED_RELEASE_CAPSULE | Freq: Two times a day (BID) | ORAL | 0 refills | Status: DC

## 2019-05-15 MED ORDER — SODIUM CHLORIDE 0.9 % IV SOLN: 500.0000 mL | INTRAVENOUS | Status: DC

## 2019-05-15 NOTE — Progress Notes (Signed)
Called to room to assist during endoscopic procedure.  Patient ID and intended procedure confirmed with present staff. Received instructions for my participation in the procedure from the performing physician.  

## 2019-05-15 NOTE — Progress Notes (Signed)
Report given to PACU, vss 

## 2019-05-15 NOTE — Patient Instructions (Signed)
Handouts on gastritis, hiatal hernia, and gastric ulcers given to you today.  Increase prilosec 40mg  two times and day for 8 weeks.  Await pathology results. NO ASPIRIN, ASPIRIN CONTAINING PRODUCTS (BC OR GOODY POWDERS) OR NSAIDS (IBUPROFEN, ADVIL, ALEVE, AND MOTRIN), TYLENOL IS OK TO TAKE  YOU HAD AN ENDOSCOPIC PROCEDURE TODAY AT THE [facility]:   Refer to the procedure report that was given to you for any specific questions about what was found during the examination.  If the procedure report does not answer your questions, please call your gastroenterologist to clarify.  If you requested that your care partner not be given the details of your procedure findings, then the procedure report has been included in a sealed envelope for you to review at your convenience later.  YOU SHOULD EXPECT: Some feelings of bloating in the abdomen. Passage of more gas than usual.  Walking can help get rid of the air that was put into your GI tract during the procedure and reduce the bloating. If you had a lower endoscopy (such as a colonoscopy or flexible sigmoidoscopy) you may notice spotting of blood in your stool or on the toilet paper. If you underwent a bowel prep for your procedure, you may not have a normal bowel movement for a few days.  Please Note:  You might notice some irritation and congestion in your nose or some drainage.  This is from the oxygen used during your procedure.  There is no need for concern and it should clear up in a day or so.  SYMPTOMS TO REPORT IMMEDIATELY:   Following upper endoscopy (EGD)  Vomiting of blood or coffee ground material  New chest pain or pain under the shoulder blades  Painful or persistently difficult swallowing  New shortness of breath  Fever of 100F or higher  Black, tarry-looking stools  For urgent or emergent issues, a gastroenterologist can be reached at any hour by calling [phone].   DIET:  We do recommend a small meal at first,  but then you may proceed to your regular diet.  Drink plenty of fluids but you should avoid alcoholic beverages for 24 hours.  ACTIVITY:  You should plan to take it easy for the rest of today and you should NOT DRIVE or use heavy machinery until tomorrow (because of the sedation medicines used during the test).    FOLLOW UP: Our staff will call the number listed on your records 48-72 hours following your procedure to check on you and address any questions or concerns that you may have regarding the information given to you following your procedure. If we do not reach you, we will leave a message.  We will attempt to reach you two times.  During this call, we will ask if you have developed any symptoms of COVID 19. If you develop any symptoms (ie: fever, flu-like symptoms, shortness of breath, cough etc.) before then, please call [phone].  If you test positive for Covid 19 in the 2 weeks post procedure, please call and report this information to [phone].    If any biopsies were taken you will be contacted by phone or by letter within the next 1-3 weeks.  Please call us at [phone] if you have not heard about the biopsies in 3 weeks.    SIGNATURES/CONFIDENTIALITY: You and/or your care partner have signed paperwork which will be entered into your electronic medical record.  These signatures attest to the fact that that the information above on  your After Visit Summary has been reviewed and is understood.  Full responsibility of the confidentiality of this discharge information lies with you and/or your care-partner. 

## 2019-05-15 NOTE — Progress Notes (Signed)
Temp LC V/s CW 

## 2019-05-15 NOTE — Telephone Encounter (Signed)
Husband called for wife regarding abdominal pain (same as previous). Had EGD today with Dr. Barron Alvine (reviewed). Small ulcers. I reiterated recommendations and answered questions.

## 2019-05-15 NOTE — Op Note (Signed)
[facility] Patient Name: Angela Adams Procedure Date: 05/15/2019 8:02 AM MRN: [id number] Endoscopist: Doristine Locks , MD Age: 54 Referring MD:  Date of Birth: Oct 03, 1965 Gender: Female Account #: [id number] Procedure:                Upper GI endoscopy Indications:              Epigastric abdominal pain, Iron deficiency anemia,                            Nausea                           54 yo female with epigastric pain, worse with                            supine and post prandial, nausea without emesis,                            and new iron deficiency anemia, with H/H 10/35,                            MCV/RDW 75/16, without overt GI blood loss.                            Currently taking omeprazole 40 mg/day with                            improvement but not resolution of pain. Medicines:                Monitored Anesthesia Care Procedure:                Pre-Anesthesia Assessment:                           - Prior to the procedure, a History and Physical                            was performed, and patient medications and                            allergies were reviewed. The patient's tolerance of                            previous anesthesia was also reviewed. The risks                            and benefits of the procedure and the sedation                            options and risks were discussed with the patient.                            All questions were answered, and informed consent  was obtained. Prior Anticoagulants: The patient has                            taken no previous anticoagulant or antiplatelet                            agents. ASA Grade Assessment: II - A patient with                            mild systemic disease. After reviewing the risks                            and benefits, the patient was deemed in                            satisfactory condition to undergo the procedure.   After obtaining informed consent, the endoscope was                            passed under direct vision. Throughout the                            procedure, the patient's blood pressure, pulse, and                            oxygen saturations were monitored continuously. The                            Endoscope was introduced through the mouth, and                            advanced to the second part of duodenum. The upper                            GI endoscopy was accomplished without difficulty.                            The patient tolerated the procedure well. Scope In: Scope Out: Findings:                 The upper third of the esophagus, middle third of                            the esophagus and lower third of the esophagus were                            normal.                           The Z-line was regular and was found 40 cm from the                            incisors.  A 1 cm hiatal hernia was present.                           The gastroesophageal flap valve was visualized                            endoscopically and classified as Hill Grade III                            (minimal fold, loose to endoscope, hiatal hernia                            likely).                           Two non-bleeding cratered gastric ulcers with no                            stigmata of bleeding were found in the gastric                            antrum. The largest lesion was 4 mm in largest                            dimension. Biopsies were taken with a cold forceps                            for histology. Estimated blood loss was minimal.                           Scattered mild inflammation characterized by                            erythema was found in the gastric body and in the                            gastric antrum. Biopsies were taken with a cold                            forceps for Helicobacter pylori testing. Estimated                             blood loss was minimal.                           The duodenal bulb, first portion of the duodenum                            and second portion of the duodenum were normal.                            Biopsies for histology were taken with a cold  forceps for evaluation of celiac disease. Estimated                            blood loss was minimal. Complications:            No immediate complications. Estimated Blood Loss:     Estimated blood loss was minimal. Impression:               - Normal upper third of esophagus, middle third of                            esophagus and lower third of esophagus.                           - Z-line regular, 40 cm from the incisors.                           - 1 cm hiatal hernia.                           - Gastroesophageal flap valve classified as Hill                            Grade III (minimal fold, loose to endoscope, hiatal                            hernia likely).                           - Non-bleeding gastric ulcers with no stigmata of                            bleeding. Biopsied.                           - Gastritis. Biopsied.                           - Normal duodenal bulb, first portion of the                            duodenum and second portion of the duodenum.                            Biopsied. Recommendation:           - Patient has a contact number available for                            emergencies. The signs and symptoms of potential                            delayed complications were discussed with the                            patient. Return to normal activities tomorrow.  Written discharge instructions were provided to the                            patient.                           - Resume previous diet.                           - Continue present medications.                           - Await pathology results.                           - No  aspirin, ibuprofen, naproxen, or other                            non-steroidal anti-inflammatory drugs.                           - Increase Prilosec (omeprazole) to 40 mg PO BID                            for 8 weeks, then reduce back to 40 mg/day and will                            hopefully be able to titrate off completely given                            lack of prior reflux symptoms.                           - Repeat CBC and iron panel in 2 months.                           - If not complete resolution of abdominal pain with                            trial of high dose PPI, plan for repeat EGD to                            ensure appropriate mucosal healing of ulcers.                           - If still with IDA on repeat labs, plan for                            colonoscopy +/- repeat EGD followed by [facility] as                            appropriate.                           - Follow-up with Dr. Barron Alvine in  the GI clinic in                            6-8 weeks or sooner as needed. Gerrit Heck, MD 05/15/2019 8:30:26 AM

## 2019-05-16 NOTE — Telephone Encounter (Signed)
Our clinic contact the patient's morning, with the following note: Spoke to patient.  She states she woke up around 330 with sternum pain that radiated to both shoulders, pain score at 10.  She took 1000 mg of Tylenol.  She woke up this morning and felt a little better.  Pain was at a 5.  No fever or nausea/vomiting.  She will continue to take Tylenol as needed today, rest and to call if she develops a fever.  She is taking Prilosec 40 mg twice daily and Carafate as prescribed.  All questions answered, patient voiced understanding.

## 2019-05-19 ENCOUNTER — Telehealth: Payer: Self-pay

## 2019-05-19 DIAGNOSIS — D509 Iron deficiency anemia, unspecified: Secondary | ICD-10-CM

## 2019-05-19 DIAGNOSIS — R1013 Epigastric pain: Secondary | ICD-10-CM

## 2019-05-19 DIAGNOSIS — R110 Nausea: Secondary | ICD-10-CM

## 2019-05-19 NOTE — Telephone Encounter (Signed)
Per EGD procedure notation

## 2019-05-19 NOTE — Telephone Encounter (Signed)
  Follow up Call-  Call back number 05/15/2019  Post procedure Call Back phone  # [phone]  Permission to leave phone message Yes  Some recent data might be hidden     Patient questions:  Do you have a fever, pain , or abdominal swelling? No. Pain Score  0 *  Have you tolerated food without any problems? Yes.    Have you been able to return to your normal activities? Yes.    Do you have any questions about your discharge instructions: Diet   No. Medications  No. Follow up visit  No.  Do you have questions or concerns about your Care? No.  Actions: * If pain score is 4 or above: No action needed, pain <4.  1. Have you developed a fever since your procedure? no  2.   Have you had an respiratory symptoms (SOB or cough) since your procedure? no  3.   Have you tested positive for COVID 19 since your procedure no  4.   Have you had any family members/close contacts diagnosed with the COVID 19 since your procedure?  no   If yes to any of these questions please route to Laverna Peace, RN and Jennye Boroughs, Charity fundraiser.

## 2019-05-19 NOTE — Telephone Encounter (Signed)
1st follow up call made.  NALM 

## 2019-05-21 ENCOUNTER — Encounter: Payer: Self-pay | Admitting: Gastroenterology

## 2019-07-17 ENCOUNTER — Telehealth: Payer: Self-pay

## 2019-07-17 NOTE — Telephone Encounter (Signed)
-----   Message from Johnney Killian, RN sent at 05/19/2019 10:29 AM EST ----- Regarding: repeat labs Per the EGD procedure note, this patient is to have repeat lab work done now per Dr. Salena Saner; The orders have already been placed in Epic- will you please contact the patient and remind her to come in for the lab work? Thank you Bre

## 2019-07-17 NOTE — Telephone Encounter (Signed)
Spoke to patient this morning to remind her of labs that are due this month post EGD. Patient stated she will go by the [facility] office tomorrow and have labs done.

## 2019-07-29 ENCOUNTER — Other Ambulatory Visit (INDEPENDENT_AMBULATORY_CARE_PROVIDER_SITE_OTHER): Payer: BC Managed Care – PPO

## 2019-07-29 DIAGNOSIS — R110 Nausea: Secondary | ICD-10-CM

## 2019-07-29 DIAGNOSIS — R1013 Epigastric pain: Secondary | ICD-10-CM

## 2019-07-29 DIAGNOSIS — D509 Iron deficiency anemia, unspecified: Secondary | ICD-10-CM | POA: Diagnosis not present

## 2019-07-29 LAB — CBC WITH DIFFERENTIAL/PLATELET
Basophils Absolute: 0.1 10*3/uL (ref 0.0–0.1)
Basophils Relative: 1.5 % (ref 0.0–3.0)
Eosinophils Absolute: 0.4 10*3/uL (ref 0.0–0.7)
Eosinophils Relative: 6.4 % — ABNORMAL HIGH (ref 0.0–5.0)
HCT: 36.6 % (ref 36.0–46.0)
Hemoglobin: 11.5 g/dL — ABNORMAL LOW (ref 12.0–15.0)
Lymphocytes Relative: 32.6 % (ref 12.0–46.0)
Lymphs Abs: 1.8 10*3/uL (ref 0.7–4.0)
MCHC: 31.5 g/dL (ref 30.0–36.0)
MCV: 72.8 fl — ABNORMAL LOW (ref 78.0–100.0)
Monocytes Absolute: 0.4 10*3/uL (ref 0.1–1.0)
Monocytes Relative: 7.7 % (ref 3.0–12.0)
Neutro Abs: 2.9 10*3/uL (ref 1.4–7.7)
Neutrophils Relative %: 51.8 % (ref 43.0–77.0)
Platelets: 278 10*3/uL (ref 150.0–400.0)
RBC: 5.03 Mil/uL (ref 3.87–5.11)
RDW: 21.4 % — ABNORMAL HIGH (ref 11.5–15.5)
WBC: 5.6 10*3/uL (ref 4.0–10.5)

## 2019-07-29 LAB — IRON,TIBC AND FERRITIN PANEL
%SAT: 6 % — ABNORMAL LOW (ref 16–45)
Ferritin: 3 ng/mL — ABNORMAL LOW (ref 16–232)
Iron: 24 ug/dL — ABNORMAL LOW (ref 45–160)
TIBC: 427 ug/dL (ref 250–450)

## 2019-08-06 ENCOUNTER — Other Ambulatory Visit: Payer: Self-pay

## 2019-08-06 ENCOUNTER — Ambulatory Visit (AMBULATORY_SURGERY_CENTER): Payer: Self-pay

## 2019-08-06 VITALS — Temp 96.9°F | Ht 67.0 in | Wt 157.6 lb

## 2019-08-06 DIAGNOSIS — Z86010 Personal history of colonic polyps: Secondary | ICD-10-CM

## 2019-08-06 DIAGNOSIS — Z01818 Encounter for other preprocedural examination: Secondary | ICD-10-CM

## 2019-08-06 DIAGNOSIS — D509 Iron deficiency anemia, unspecified: Secondary | ICD-10-CM

## 2019-08-06 NOTE — Progress Notes (Signed)
No allergies to soy or egg Pt is not on blood thinners or diet pills Denies issues with sedation/intubation Denies atrial flutter/fib Denies constipation   Emmi instructions given to pt  Pt is aware of Covid safety and care partner requirements.   Prep changed to Miralax per pt's request.

## 2019-08-18 ENCOUNTER — Other Ambulatory Visit: Payer: Self-pay | Admitting: Gastroenterology

## 2019-08-18 ENCOUNTER — Ambulatory Visit (INDEPENDENT_AMBULATORY_CARE_PROVIDER_SITE_OTHER): Payer: BC Managed Care – PPO

## 2019-08-18 DIAGNOSIS — Z1159 Encounter for screening for other viral diseases: Secondary | ICD-10-CM

## 2019-08-18 LAB — SARS CORONAVIRUS 2 (TAT 6-24 HRS): SARS Coronavirus 2: NEGATIVE

## 2019-08-20 ENCOUNTER — Ambulatory Visit (AMBULATORY_SURGERY_CENTER): Payer: BC Managed Care – PPO | Admitting: Gastroenterology

## 2019-08-20 ENCOUNTER — Encounter: Payer: Self-pay | Admitting: Gastroenterology

## 2019-08-20 ENCOUNTER — Other Ambulatory Visit: Payer: Self-pay

## 2019-08-20 VITALS — BP 137/86 | HR 91 | Temp 97.8°F | Resp 14 | Ht 67.0 in | Wt 157.6 lb

## 2019-08-20 DIAGNOSIS — K599 Functional intestinal disorder, unspecified: Secondary | ICD-10-CM | POA: Diagnosis not present

## 2019-08-20 DIAGNOSIS — D509 Iron deficiency anemia, unspecified: Secondary | ICD-10-CM | POA: Diagnosis not present

## 2019-08-20 DIAGNOSIS — K5730 Diverticulosis of large intestine without perforation or abscess without bleeding: Secondary | ICD-10-CM

## 2019-08-20 DIAGNOSIS — K6389 Other specified diseases of intestine: Secondary | ICD-10-CM

## 2019-08-20 MED ORDER — SODIUM CHLORIDE 0.9 % IV SOLN: 500.0000 mL | Freq: Once | INTRAVENOUS | Status: DC

## 2019-08-20 NOTE — Progress Notes (Signed)
Called to room to assist during endoscopic procedure.  Patient ID and intended procedure confirmed with present staff. Received instructions for my participation in the procedure from the performing physician.  

## 2019-08-20 NOTE — Progress Notes (Signed)
A/ox3, pleased with MAC, report to RN 

## 2019-08-20 NOTE — Op Note (Signed)
[facility] Patient Name: Angela Adams Procedure Date: 08/20/2019 3:30 PM MRN: [id number] Endoscopist: Doristine Locks , MD Age: 54 Referring MD:  Date of Birth: 17-Aug-1965 Gender: Female Account #: [id number] Procedure:                Colonoscopy Indications:              Iron deficiency anemia Medicines:                Monitored Anesthesia Care Procedure:                Pre-Anesthesia Assessment:                           - Prior to the procedure, a History and Physical                            was performed, and patient medications and                            allergies were reviewed. The patient's tolerance of                            previous anesthesia was also reviewed. The risks                            and benefits of the procedure and the sedation                            options and risks were discussed with the patient.                            All questions were answered, and informed consent                            was obtained. Prior Anticoagulants: The patient has                            taken no previous anticoagulant or antiplatelet                            agents. ASA Grade Assessment: II - A patient with                            mild systemic disease. After reviewing the risks                            and benefits, the patient was deemed in                            satisfactory condition to undergo the procedure.                           After obtaining informed consent, the colonoscope  was passed under direct vision. Throughout the                            procedure, the patient's blood pressure, pulse, and                            oxygen saturations were monitored continuously. The                            Colonoscope was introduced through the anus and                            advanced to the the terminal ileum. The colonoscopy                            was performed without difficulty.  The patient                            tolerated the procedure well. The quality of the                            bowel preparation was good. The terminal ileum,                            ileocecal valve, appendiceal orifice, and rectum                            were photographed. Scope In: 4:04:54 PM Scope Out: 4:21:01 PM Scope Withdrawal Time: 0 hours 11 minutes 26 seconds  Total Procedure Duration: 0 hours 16 minutes 7 seconds  Findings:                 The perianal and digital rectal examinations were                            normal.                           A diffuse area of melanosis was found in the entire                            colon. Biopsies were taken with a cold forceps for                            histology. Estimated blood loss was minimal.                           A few small-mouthed diverticula were found in the                            sigmoid colon.                           The terminal ileum appeared normal.  Anal papilla(e) were hypertrophied. Complications:            No immediate complications. Estimated Blood Loss:     Estimated blood loss was minimal. 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                           - Diverticulosis in the sigmoid colon.                           - The examined portion of the ileum was normal.                           - Anal papilla(e) were hypertrophied. Recommendation:           - Patient has a contact number available for                            emergencies. The signs and symptoms of potential                            delayed complications were discussed with the                            patient. Return to normal activities tomorrow.                            Written discharge instructions were provided to the                            patient.                           - Resume previous diet.                           - Continue present medications.                            - Await pathology results.                           - Repeat colonoscopy in 5 years for screening                            purposes due to family history of polyps.                           - To visualize the small bowel, perform video                            capsule endoscopy at the next available appointment.                           - Follow-up in the GI Clinic after VCE complete. Doristine Locks, MD 08/20/2019 4:32:23 PM

## 2019-08-20 NOTE — Patient Instructions (Signed)
   YOU HAD AN ENDOSCOPIC PROCEDURE TODAY AT THE [facility]:   Refer to the procedure report that was given to you for any specific questions about what was found during the examination.  If the procedure report does not answer your questions, please call your gastroenterologist to clarify.  If you requested that your care partner not be given the details of your procedure findings, then the procedure report has been included in a sealed envelope for you to review at your convenience later.  YOU SHOULD EXPECT: Some feelings of bloating in the abdomen. Passage of more gas than usual.  Walking can help get rid of the air that was put into your GI tract during the procedure and reduce the bloating. If you had a lower endoscopy (such as a colonoscopy or flexible sigmoidoscopy) you may notice spotting of blood in your stool or on the toilet paper. If you underwent a bowel prep for your procedure, you may not have a normal bowel movement for a few days.  Please Note:  You might notice some irritation and congestion in your nose or some drainage.  This is from the oxygen used during your procedure.  There is no need for concern and it should clear up in a day or so.  SYMPTOMS TO REPORT IMMEDIATELY:  Following lower endoscopy (colonoscopy or flexible sigmoidoscopy):  Excessive amounts of blood in the stool  Significant tenderness or worsening of abdominal pains  Swelling of the abdomen that is new, acute  Fever of 100F or higher   Black, tarry-looking stools  For urgent or emergent issues, a gastroenterologist can be reached at any hour by calling [phone]. Do not use MyChart messaging for urgent concerns.    DIET:  We do recommend a small meal at first, but then you may proceed to your regular diet.  Drink plenty of fluids but you should avoid alcoholic beverages for 24 hours.  ACTIVITY:  You should plan to take it easy for the rest of today and you should NOT DRIVE or use heavy  machinery until tomorrow (because of the sedation medicines used during the test).    FOLLOW UP: Our staff will call the number listed on your records 48-72 hours following your procedure to check on you and address any questions or concerns that you may have regarding the information given to you following your procedure. If we do not reach you, we will leave a message.  We will attempt to reach you two times.  During this call, we will ask if you have developed any symptoms of COVID 19. If you develop any symptoms (ie: fever, flu-like symptoms, shortness of breath, cough etc.) before then, please call [phone].  If you test positive for Covid 19 in the 2 weeks post procedure, please call and report this information to us.    If any biopsies were taken you will be contacted by phone or by letter within the next 1-3 weeks.  Please call us at [phone] if you have not heard about the biopsies in 3 weeks.    SIGNATURES/CONFIDENTIALITY: You and/or your care partner have signed paperwork which will be entered into your electronic medical record.  These signatures attest to the fact that that the information above on your After Visit Summary has been reviewed and is understood.  Full responsibility of the confidentiality of this discharge information lies with you and/or your care-partner.  

## 2019-08-20 NOTE — Progress Notes (Signed)
Pt. Reports no change in her medical or surgical history since her pre-visit 08/06/2019.

## 2019-08-22 ENCOUNTER — Telehealth: Payer: Self-pay

## 2019-08-22 NOTE — Telephone Encounter (Signed)
  Follow up Call-  Call back number 08/20/2019 05/15/2019  Post procedure Call Back phone  # [phone]  Permission to leave phone message Yes Yes  Some recent data might be hidden     Patient questions:  Do you have a fever, pain , or abdominal swelling? No. Pain Score  0 *  Have you tolerated food without any problems? Yes.    Have you been able to return to your normal activities? Yes.    Do you have any questions about your discharge instructions: Diet   No. Medications  No. Follow up visit  No.  Do you have questions or concerns about your Care? No.  Actions: * If pain score is 4 or above: No action needed, pain <4.  1. Have you developed a fever since your procedure? no  2.   Have you had an respiratory symptoms (SOB or cough) since your procedure? no  3.   Have you tested positive for COVID 19 since your procedure no  4.   Have you had any family members/close contacts diagnosed with the COVID 19 since your procedure?  no   If yes to any of these questions please route to Laverna Peace, RN and Charlett Lango, RN

## 2019-08-22 NOTE — Telephone Encounter (Signed)
Left message on follow up call. 

## 2019-09-02 ENCOUNTER — Other Ambulatory Visit: Payer: Self-pay | Admitting: Gastroenterology

## 2019-09-02 DIAGNOSIS — D509 Iron deficiency anemia, unspecified: Secondary | ICD-10-CM

## 2019-09-02 DIAGNOSIS — R1013 Epigastric pain: Secondary | ICD-10-CM

## 2019-09-02 DIAGNOSIS — K6389 Other specified diseases of intestine: Secondary | ICD-10-CM

## 2019-09-16 ENCOUNTER — Telehealth: Payer: Self-pay | Admitting: Gastroenterology

## 2019-09-16 ENCOUNTER — Other Ambulatory Visit: Payer: Self-pay | Admitting: Gastroenterology

## 2019-09-16 MED ORDER — OMEPRAZOLE 40 MG PO CPDR: 40.0000 mg | DELAYED_RELEASE_CAPSULE | Freq: Every day | ORAL | 3 refills | Status: AC

## 2019-09-16 NOTE — Telephone Encounter (Signed)
Refills sent to phrmacy

## 2019-09-16 NOTE — Telephone Encounter (Signed)
Patient requesting refill on Omeprazole 

## 2019-09-19 ENCOUNTER — Encounter: Payer: Self-pay | Admitting: Gastroenterology

## 2019-09-19 ENCOUNTER — Ambulatory Visit (INDEPENDENT_AMBULATORY_CARE_PROVIDER_SITE_OTHER): Payer: BC Managed Care – PPO | Admitting: Gastroenterology

## 2019-09-19 DIAGNOSIS — D509 Iron deficiency anemia, unspecified: Secondary | ICD-10-CM

## 2019-09-19 DIAGNOSIS — K269 Duodenal ulcer, unspecified as acute or chronic, without hemorrhage or perforation: Secondary | ICD-10-CM

## 2019-09-19 NOTE — Patient Instructions (Signed)

## 2019-09-19 NOTE — Progress Notes (Signed)
SN: [id number] Exp: 2020-10-16  LOT: 05-02[birth year]70 Patient arrived for Capsule Endoscopy. Reported the prep went well. This nurse explained capsule retrieval process and dietary restrictions for the next few hours. Patient verbalized understanding. Opened capsule, ensured capsule was flashing prior to the patient swallowing the capsule. Patient swallowed capsule without difficulty. Patient given retrieval supplies. Patient told to call the office with any questions and if no capsule was retrieved after 72 hours. No further questions by the conclusion of the visit   Capsule study complete, images reviewed, and notable for the following:  -First gastric image at 00:00:07 -First duodenal image at 00:12:55 -First cecal image at 04:34:14 -Small bowel transit time: 04: 21: 19  -Complete study with adequate prep -1 single erosion at 45-minute mark, approximately 32 minutes into the small bowel.  Otherwise, normal study without any blood in the small bowel lumen.  Recommendations: -Avoid NSAIDs -Continue oral iron therapy -Repeat CBC and iron panel later this month (last checked 07/29/2019) -Schedule follow-up in GI clinic -Do not feel that additional endoscopic evaluation (i.e. single balloon enteroscopy) is necessary for further evaluation of the single, nonbleeding small bowel erosion.  Full report scanned into records

## 2019-10-20 ENCOUNTER — Other Ambulatory Visit: Payer: Self-pay | Admitting: Gastroenterology

## 2019-10-20 ENCOUNTER — Telehealth: Payer: Self-pay

## 2019-10-20 DIAGNOSIS — D509 Iron deficiency anemia, unspecified: Secondary | ICD-10-CM

## 2019-10-20 DIAGNOSIS — K269 Duodenal ulcer, unspecified as acute or chronic, without hemorrhage or perforation: Secondary | ICD-10-CM

## 2019-10-20 NOTE — Telephone Encounter (Signed)
Spoke to patient to inform her or recent VCE results and recommendations. A follow up appointment scheduled.All questions answered.Patient voiced understanding

## 2019-10-20 NOTE — Telephone Encounter (Signed)
-----   Message from Evonnie Pat, RMA sent at 10/17/2019  4:34 PM EDT ----- Regarding: FW: VCE results Sending this to you - not sure about Impression. thx ----- Message ----- From: Shellia Cleverly, DO Sent: 10/15/2019   3:42 PM EDT To: Evonnie Pat, RMA Subject: VCE results                                    Can you please call this patient to convey the results of the VCE as follows:Capsule study complete, images reviewed, and notable for the following:  -First gastric image at 00:00:07 -First duodenal image at 00:12:55 -First cecal image at 04:34:14 -Small bowel transit time: 04: 21: 19  -Complete study with adequate prep -1 single erosion at 45-minute mark, approximately 32 minutes into the small bowel.  Otherwise, normal study without any blood in the small bowel lumen.  Recommendations: -Avoid NSAIDs -Continue oral iron therapy -Repeat CBC and iron panel later this month (last checked 07/29/2019) -Schedule follow-up in GI clinic -Do not feel that additional endoscopic evaluation (i.e. single balloon enteroscopy) is necessary for further evaluation of the single, nonbleeding small bowel erosion.

## 2019-10-24 ENCOUNTER — Other Ambulatory Visit (INDEPENDENT_AMBULATORY_CARE_PROVIDER_SITE_OTHER): Payer: BC Managed Care – PPO

## 2019-10-24 DIAGNOSIS — D509 Iron deficiency anemia, unspecified: Secondary | ICD-10-CM

## 2019-10-24 DIAGNOSIS — K269 Duodenal ulcer, unspecified as acute or chronic, without hemorrhage or perforation: Secondary | ICD-10-CM

## 2019-10-24 LAB — CBC WITH DIFFERENTIAL/PLATELET
Basophils Absolute: 0.2 10*3/uL — ABNORMAL HIGH (ref 0.0–0.1)
Basophils Relative: 2.4 % (ref 0.0–3.0)
Eosinophils Absolute: 0.5 10*3/uL (ref 0.0–0.7)
Eosinophils Relative: 7.2 % — ABNORMAL HIGH (ref 0.0–5.0)
HCT: 39.7 % (ref 36.0–46.0)
Hemoglobin: 13 g/dL (ref 12.0–15.0)
Lymphocytes Relative: 32.6 % (ref 12.0–46.0)
Lymphs Abs: 2.1 10*3/uL (ref 0.7–4.0)
MCHC: 32.8 g/dL (ref 30.0–36.0)
MCV: 79.4 fl (ref 78.0–100.0)
Monocytes Absolute: 0.5 10*3/uL (ref 0.1–1.0)
Monocytes Relative: 7.1 % (ref 3.0–12.0)
Neutro Abs: 3.3 10*3/uL (ref 1.4–7.7)
Neutrophils Relative %: 50.7 % (ref 43.0–77.0)
Platelets: 275 10*3/uL (ref 150.0–400.0)
RBC: 5 Mil/uL (ref 3.87–5.11)
RDW: 19.1 % — ABNORMAL HIGH (ref 11.5–15.5)
WBC: 6.5 10*3/uL (ref 4.0–10.5)

## 2019-10-24 LAB — IBC + FERRITIN
Ferritin: 7.9 ng/mL — ABNORMAL LOW (ref 10.0–291.0)
Iron: 11 ug/dL — ABNORMAL LOW (ref 42–145)
Saturation Ratios: 2.1 % — ABNORMAL LOW (ref 20.0–50.0)
Transferrin: 368 mg/dL — ABNORMAL HIGH (ref 212.0–360.0)

## 2019-11-05 ENCOUNTER — Telehealth: Payer: Self-pay | Admitting: Gastroenterology

## 2019-11-05 ENCOUNTER — Other Ambulatory Visit: Payer: Self-pay | Admitting: Gastroenterology

## 2019-11-05 DIAGNOSIS — D509 Iron deficiency anemia, unspecified: Secondary | ICD-10-CM

## 2019-12-02 ENCOUNTER — Ambulatory Visit (INDEPENDENT_AMBULATORY_CARE_PROVIDER_SITE_OTHER): Payer: BC Managed Care – PPO | Admitting: Gastroenterology

## 2019-12-02 ENCOUNTER — Other Ambulatory Visit: Payer: Self-pay

## 2019-12-02 ENCOUNTER — Encounter: Payer: Self-pay | Admitting: Gastroenterology

## 2019-12-02 VITALS — BP 128/82 | HR 92 | Ht 67.0 in | Wt 162.0 lb

## 2019-12-02 DIAGNOSIS — D509 Iron deficiency anemia, unspecified: Secondary | ICD-10-CM

## 2019-12-02 DIAGNOSIS — Z8371 Family history of colonic polyps: Secondary | ICD-10-CM | POA: Diagnosis not present

## 2019-12-02 NOTE — Patient Instructions (Signed)
Please follow up with Dr. Barron Alvine approximately 2 months after you start your infusions

## 2019-12-02 NOTE — Progress Notes (Signed)
P  Chief Complaint:    Iron deficiency anemia, procedure follow-up  GI History: Angela Adams is a 54 y.o. female with a history of ccy 2016, hysterectomy, initially seen in the GI clinic in 05/03/2019 with c/o MEG pain radiating to the back.  Similar symptoms which resolved with ccy to 2016, but recurred towards the end of 2020.  Associated nausea without emesis.  Sxs worse with "acidic foods", tomato based foods, typically within 60 mins or so. Also worse with supine.  The symptoms have mostly abated.  During that evaluation, was diagnosed with iron deficiency anemia with H/H 10.3/35.5 (MCV/RDW 75/17.6), ferritin 5.5, iron 28, sat 5.5%.  Treated with p.o. iron, without significant improvement in iron indices on repeat studies in 06/2019 (H/H 11.5/36, ferritin 3, iron 24, TIBC 427, sat 6%).  Repeat labs in 09/2019: H/H 13/39.7, but ferritin 7.9, iron 11, sat 2.1%.  Was referred to Hematology, with an appointment scheduled for next week and likely IV iron.  Family history of colon polyps in both mother and father, but no family history of CRC.  CBC in 10/2017 with H/H 14.5/43.2, MCV/RDW 87.2/13.5.  Vitamin D 31 in 04/2019.  Endoscopic history: -Colonoscopy (01/2012, [facility]): Internal hemorrhoids, otherwise normal.  Biopsies negative for MC.  Repeat in 5 years due to family history -Colonoscopy (03/2017, [facility]): No report for review, but recommended repeat in 5 years -EGD (05/2019, Dr. Barron Alvine): 1 cm HH, Hill grade 3, 2 small gastric ulcers, mild non-H. pylori gastritis, normal duodenum (normal biopsies) -Colonoscopy (07/2019, Dr. Barron Alvine): Sigmoid diverticulosis, melanosis coli, normal TI.  Repeat in 5 years -VCE (10/2019, Dr. Barron Alvine): Single erosion at 45 minutes, otherwise normal study  HPI:     Patient is a 54 y.o. female presenting to the Gastroenterology Clinic for follow-up.  Other than fatigue, no specific complaints today.  No overt GI blood loss.  Review of systems:     No  chest pain, no SOB, no fevers, no urinary sx   Past Medical History:  Diagnosis Date  . Allergy    seasonal  . Anemia    IDA  . Arthritis   . Bulging lumbar disc    L1 L2  . History of colon polyps    polyp was precancerous  . Kidney stone   . Protrusion of lumbar intervertebral disc    L3 L4    Patient's surgical history, family medical history, social history, medications and allergies were all reviewed in Epic    Current Outpatient Medications  Medication Sig Dispense Refill  . amphetamine-dextroamphetamine (ADDERALL XR) 30 MG 24 hr capsule Take 30 mg by mouth daily.    Marland Kitchen azelastine (ASTELIN) 0.1 % nasal spray Place 2 sprays into both nostrils as needed.     . B Complex Vitamins (VITAMIN B COMPLEX PO) Take 1 tablet by mouth daily.     . Cholecalciferol (VITAMIN D3) 3000 units TABS Take by mouth.    . desonide (DESOWEN) 0.05 % ointment Apply twice daily as needed for red itchy areas. (Patient not taking: Reported on 08/20/2019) 15 g 5  . EPINEPHrine (AUVI-Q) 0.3 mg/0.3 mL IJ SOAJ injection Use as directed for severe allergic reactions (Patient not taking: Reported on 08/20/2019) 2 Device 3  . estradiol (ESTRACE) 1 MG tablet Take 1 mg by mouth daily.    Marland Kitchen FIBER PO Take 1 tablet by mouth daily.     . fluticasone (FLONASE) 50 MCG/ACT nasal spray Place 2 sprays into both nostrils as needed.     Marland Kitchen  Garlic 100 MG TABS Take by mouth daily.     Marland Kitchen levocetirizine (XYZAL) 5 MG tablet Take 1 tablet (5 mg total) by mouth every evening. 30 tablet 0  . Multiple Vitamins-Minerals (ZINC PO) Take by mouth.    . Omega-3 Fatty Acids (FISH OIL) 1000 MG CAPS Take 1 tablet by mouth 2 (two) times daily.    Marland Kitchen omeprazole (PRILOSEC) 40 MG capsule Take 1 capsule (40 mg total) by mouth 2 (two) times daily. 120 capsule 0  . omeprazole (PRILOSEC) 40 MG capsule Take 1 capsule (40 mg total) by mouth daily. 30 capsule 3  . sucralfate (CARAFATE) 1 GM/10ML suspension Take 10 mLs (1 g total) by mouth 4 (four) times  daily -  with meals and at bedtime. (Patient not taking: Reported on 08/20/2019) 420 mL 0  . vitamin A 09326 UNIT capsule Take 10,000 Units by mouth daily.    . vitamin C (ASCORBIC ACID) 500 MG tablet Take 500 mg by mouth daily.    . vitamin E 1000 UNIT capsule Take 1,000 Units by mouth daily.     No current facility-administered medications for this visit.    Physical Exam:     There were no vitals taken for this visit.  GENERAL:  Pleasant female in NAD PSYCH: : Cooperative, normal affect Musculoskeletal:  Normal muscle tone, normal strength NEURO: Alert and oriented x 3, no focal neurologic deficits   IMPRESSION and PLAN:    1) Iron deficiency anemia: -Has had extensive evaluation for GI etiology for IDA as outlined above, to include EGD, colonoscopy, video capsule endoscopy -Somewhat responsive to oral iron therapy with normalization of H/H, but still with reduced iron stores -Has an appointment with Dr. Myna Hidalgo in the Hematology clinic this month for further evaluation  2) Family history of colon polyps: -Repeat colonoscopy in 2026 for ongoing screening   F/u 2 months after Heme eval          Verlin Dike Duilio Heritage ,DO, FACG 12/02/2019, 8:43 AM

## 2019-12-09 ENCOUNTER — Other Ambulatory Visit: Payer: Self-pay | Admitting: Family

## 2019-12-09 DIAGNOSIS — D649 Anemia, unspecified: Secondary | ICD-10-CM

## 2019-12-10 ENCOUNTER — Inpatient Hospital Stay: Payer: BC Managed Care – PPO

## 2019-12-10 ENCOUNTER — Inpatient Hospital Stay: Payer: BC Managed Care – PPO | Attending: Family | Admitting: Family

## 2019-12-10 ENCOUNTER — Other Ambulatory Visit: Payer: Self-pay

## 2019-12-10 ENCOUNTER — Encounter: Payer: Self-pay | Admitting: Family

## 2019-12-10 VITALS — BP 136/59 | HR 80

## 2019-12-10 DIAGNOSIS — D509 Iron deficiency anemia, unspecified: Secondary | ICD-10-CM | POA: Diagnosis present

## 2019-12-10 DIAGNOSIS — D500 Iron deficiency anemia secondary to blood loss (chronic): Secondary | ICD-10-CM | POA: Diagnosis not present

## 2019-12-10 DIAGNOSIS — Z79899 Other long term (current) drug therapy: Secondary | ICD-10-CM | POA: Diagnosis not present

## 2019-12-10 DIAGNOSIS — D649 Anemia, unspecified: Secondary | ICD-10-CM

## 2019-12-10 LAB — CMP (CANCER CENTER ONLY)
ALT: 15 U/L (ref 0–44)
AST: 20 U/L (ref 15–41)
Albumin: 4.3 g/dL (ref 3.5–5.0)
Alkaline Phosphatase: 102 U/L (ref 38–126)
Anion gap: 8 (ref 5–15)
BUN: 19 mg/dL (ref 6–20)
CO2: 30 mmol/L (ref 22–32)
Calcium: 10 mg/dL (ref 8.9–10.3)
Chloride: 103 mmol/L (ref 98–111)
Creatinine: 0.94 mg/dL (ref 0.44–1.00)
GFR, Est AFR Am: >60 mL/min (ref >60)
GFR, Estimated: >60 mL/min (ref >60)
Glucose, Bld: 100 mg/dL — ABNORMAL HIGH (ref 70–99)
Potassium: 4 mmol/L (ref 3.5–5.1)
Sodium: 141 mmol/L (ref 135–145)
Total Bilirubin: 0.4 mg/dL (ref 0.3–1.2)
Total Protein: 6.7 g/dL (ref 6.5–8.1)

## 2019-12-10 LAB — CBC WITH DIFFERENTIAL (CANCER CENTER ONLY)
Abs Immature Granulocytes: 0.09 10*3/uL — ABNORMAL HIGH (ref 0.00–0.07)
Basophils Absolute: 0.1 10*3/uL (ref 0.0–0.1)
Basophils Relative: 1 %
Eosinophils Absolute: 0.4 10*3/uL (ref 0.0–0.5)
Eosinophils Relative: 6 %
HCT: 44.8 % (ref 36.0–46.0)
Hemoglobin: 14.2 g/dL (ref 12.0–15.0)
Immature Granulocytes: 1 %
Lymphocytes Relative: 35 %
Lymphs Abs: 2.3 10*3/uL (ref 0.7–4.0)
MCH: 26.7 pg (ref 26.0–34.0)
MCHC: 31.7 g/dL (ref 30.0–36.0)
MCV: 84.4 fL (ref 80.0–100.0)
Monocytes Absolute: 0.5 10*3/uL (ref 0.1–1.0)
Monocytes Relative: 7 %
Neutro Abs: 3.2 10*3/uL (ref 1.7–7.7)
Neutrophils Relative %: 50 %
Platelet Count: 248 10*3/uL (ref 150–400)
RBC: 5.31 MIL/uL — ABNORMAL HIGH (ref 3.87–5.11)
RDW: 15.2 % (ref 11.5–15.5)
WBC Count: 6.5 10*3/uL (ref 4.0–10.5)
nRBC: 0 % (ref 0.0–0.2)

## 2019-12-10 LAB — RETICULOCYTES
Immature Retic Fract: 12.9 % (ref 2.3–15.9)
RBC.: 5.19 MIL/uL — ABNORMAL HIGH (ref 3.87–5.11)
Retic Count, Absolute: 69.5 10*3/uL (ref 19.0–186.0)
Retic Ct Pct: 1.3 % (ref 0.4–3.1)

## 2019-12-10 LAB — SAVE SMEAR(SSMR), FOR PROVIDER SLIDE REVIEW

## 2019-12-10 LAB — IRON AND TIBC
Iron: 78 ug/dL (ref 41–142)
Saturation Ratios: 16 % — ABNORMAL LOW (ref 21–57)
TIBC: 479 ug/dL — ABNORMAL HIGH (ref 236–444)
UIBC: 401 ug/dL — ABNORMAL HIGH (ref 120–384)

## 2019-12-10 LAB — LACTATE DEHYDROGENASE: LDH: 157 U/L (ref 98–192)

## 2019-12-10 LAB — FERRITIN: Ferritin: 7 ng/mL — ABNORMAL LOW (ref 11–307)

## 2019-12-10 MED ORDER — SODIUM CHLORIDE 0.9 % IV SOLN
Freq: Once | INTRAVENOUS | Status: AC
  Filled 2019-12-10: qty 250

## 2019-12-10 MED ORDER — SODIUM CHLORIDE 0.9 % IV SOLN
510.0000 mg | Freq: Once | INTRAVENOUS | Status: AC
  Administered 2019-12-10: 510 mg via INTRAVENOUS
  Filled 2019-12-10: qty 17

## 2019-12-10 NOTE — Patient Instructions (Signed)

## 2019-12-10 NOTE — Progress Notes (Signed)
Hematology/Oncology Consultation   Name: Angela Adams      MRN: [id number]    Location: Room/bed info not found  Date: 12/10/2019 Time:9:25 AM   REFERRING PHYSICIAN: Doristine Locks, MD  REASON FOR CONSULT: Iron deficiency anemia   DIAGNOSIS: Iron deficiency anemia   HISTORY OF PRESENT ILLNESS: Angela Adams is a very pleasant 54 yo caucasian female with diagnosis of iron deficiency earlier this year.  She is O positive and had been donating blood every 56 days for over 30 years.  In December [facility] told her she could not donate due to anemia.  She was seen by GI and endoscopy in January revealed a hiatal hernia, 2 small gastric ulcers and mild gastritis. Capsule endoscopy in July showed a single erosion.  She is currently on Prilosec daily and has completed treatment with Carafate.  She has not noted any obvious blood loss. No bruising or petechiae.  She failed oral iron due to no improvement.  She is symptomatic with fatigue, weakness, brain fog and chills.  Several weeks ago her ferritin was 7.9 and iron saturation 2%.  Hgb is stable today at 14.2, MCV 84 and platelets 248. Retic count stable.  No family history of anemia.  No personal history of cancer. Family history includes: Father - skin, Mother - metastatic liver and maternal grandmother - breast.  She states that she is due for her mammogram and recently got her letter in the mail to schedule.  She is fair skinned and sees dermatology for regular skin checks.  No history of diabetes or thyroid disease.  She has had a hysterectomy.  No fever, n/v, cough, rash, SOB, chest pain, palpitations, abdominal pain or changes in bowel or bladder habits.  She has occasional episodes of dizziness which she attributes to hypoglycemia.  She also has occasionally has abdominal bloating which she associates with certain foods. No swelling, tenderness, numbness or tingling in her extremities.  No falls or syncope.  She does not smoke.  She occasionally has a glass of wine socially.  She does not have much of an appetite due to the fatigue. She is doing her best to stay well hydrated. Her weight is stable at 163 lbs.  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   ROS: All other 10 point review of systems is negative.   PAST MEDICAL HISTORY:   Past Medical History:  Diagnosis Date  . Allergy    seasonal  . Anemia    IDA  . Arthritis   . Bulging lumbar disc    L1 L2  . History of colon polyps    polyp was precancerous  . Kidney stone   . Protrusion of lumbar intervertebral disc    L3 L4    ALLERGIES: Allergies  Allergen Reactions  . Tetracyclines & Related Hives  . Erythrocin Hives  . Hydrocodone Hives  . Oxycodone Itching    Patient reports she CAN take "HYDROCODONE" and "CODEINE" WITHOUT problems      MEDICATIONS:  Current Outpatient Medications on File Prior to Visit  Medication Sig Dispense Refill  . amphetamine-dextroamphetamine (ADDERALL XR) 30 MG 24 hr capsule Take 30 mg by mouth daily.    . B Complex Vitamins (VITAMIN B COMPLEX PO) Take 1 tablet by mouth daily.     . Cholecalciferol (VITAMIN D3) 3000 units TABS Take by mouth.    . desonide (DESOWEN) 0.05 % ointment Apply twice daily as needed for red itchy areas.  15 g 5  . EPINEPHrine (AUVI-Q) 0.3 mg/0.3 mL IJ SOAJ injection Use as directed for severe allergic reactions 2 Device 3  . estradiol (ESTRACE) 1 MG tablet Take 1 mg by mouth daily.    . ferrous sulfate 325 (65 FE) MG tablet Take 325 mg by mouth daily with breakfast.    . FIBER PO Take 1 tablet by mouth daily.     . Garlic 100 MG TABS Take by mouth daily.     Marland Kitchen levocetirizine (XYZAL) 5 MG tablet Take 1 tablet (5 mg total) by mouth every evening. 30 tablet 0  . Multiple Vitamins-Minerals (ZINC PO) Take by mouth.    . Omega-3 Fatty Acids (FISH OIL) 1000 MG CAPS Take 1 tablet by mouth 2 (two) times daily.    Marland Kitchen omeprazole (PRILOSEC) 40 MG capsule Take 1  capsule (40 mg total) by mouth daily. 30 capsule 3  . sucralfate (CARAFATE) 1 GM/10ML suspension Take 10 mLs (1 g total) by mouth 4 (four) times daily -  with meals and at bedtime. 420 mL 0  . vitamin A 16109 UNIT capsule Take 10,000 Units by mouth daily.    . vitamin C (ASCORBIC ACID) 500 MG tablet Take 500 mg by mouth daily.    . vitamin E 1000 UNIT capsule Take 1,000 Units by mouth daily.    Marland Kitchen azelastine (ASTELIN) 0.1 % nasal spray Place 2 sprays into both nostrils as needed.     . fluticasone (FLONASE) 50 MCG/ACT nasal spray Place 2 sprays into both nostrils as needed.      No current facility-administered medications on file prior to visit.     PAST SURGICAL HISTORY Past Surgical History:  Procedure Laterality Date  . ABDOMINAL HYSTERECTOMY    . ADENOIDECTOMY    . CHOLECYSTECTOMY    . COLONOSCOPY  2018   [facility]   . complete hysterectomy    . EPIDURAL BLOCK INJECTION     L5 S1  . GALLBLADDER SURGERY    . POLYPECTOMY    . TONSILLECTOMY    . TYMPANOSTOMY TUBE PLACEMENT    . UPPER GASTROINTESTINAL ENDOSCOPY     2021    FAMILY HISTORY: Family History  Problem Relation Age of Onset  . Liver cancer Mother   . Lung cancer Mother   . Colon polyps Mother   . Skin cancer Father   . Colon polyps Father        precancerous  . Breast cancer Maternal Grandmother   . Lung cancer Maternal Grandfather   . Colon polyps Paternal Grandmother   . Allergic rhinitis Neg Hx   . Angioedema Neg Hx   . Asthma Neg Hx   . Eczema Neg Hx   . Immunodeficiency Neg Hx   . Urticaria Neg Hx   . Colon cancer Neg Hx   . Esophageal cancer Neg Hx   . Rectal cancer Neg Hx   . Stomach cancer Neg Hx     SOCIAL HISTORY:  reports that she has never smoked. She has never used smokeless tobacco. She reports current alcohol use. She reports that she does not use drugs.  PERFORMANCE STATUS: The patient's performance status is 1 - Symptomatic but completely ambulatory  PHYSICAL EXAM: Most Recent  Vital Signs: There were no vitals taken for this visit. BP 139/89 (BP Location: Left Arm, Patient Position: Sitting)   Pulse (!) 105   Temp 98 F (36.7 C) (Oral)   Resp 18   Ht 5\' 7"  (1.702 m)  Wt 163 lb (73.9 kg)   SpO2 100%   BMI 25.53 kg/m   General Appearance:    Alert, cooperative, no distress, appears stated age  Head:    Normocephalic, without obvious abnormality, atraumatic  Eyes:    PERRL, conjunctiva/corneas clear, EOM's intact, fundi    benign, both eyes        Throat:   Lips, mucosa, and tongue normal; teeth and gums normal  Neck:   Supple, symmetrical, trachea midline, no adenopathy;    thyroid:  no enlargement/tenderness/nodules; no carotid   bruit or JVD  Back:     Symmetric, no curvature, ROM normal, no CVA tenderness  Lungs:     Clear to auscultation bilaterally, respirations unlabored  Chest Wall:    No tenderness or deformity   Heart:    Regular rate and rhythm, S1 and S2 normal, no murmur, rub   or gallop     Abdomen:     Soft, non-tender, bowel sounds active all four quadrants,    no masses, no organomegaly        Extremities:   Extremities normal, atraumatic, no cyanosis or edema  Pulses:   2+ and symmetric all extremities  Skin:   Skin color, texture, turgor normal, no rashes or lesions  Lymph nodes:   Cervical, supraclavicular, and axillary nodes normal  Neurologic:   CNII-XII intact, normal strength, sensation and reflexes    throughout    LABORATORY DATA:  Results for orders placed or performed in visit on 12/10/19 (from the past 48 hour(s))  CBC with Differential (Cancer Center Only)     Status: Abnormal   Collection Time: 12/10/19  8:43 AM  Result Value Ref Range   WBC Count 6.5 4.0 - 10.5 K/uL   RBC 5.31 (H) 3.87 - 5.11 MIL/uL   Hemoglobin 14.2 12.0 - 15.0 g/dL   HCT 41.7 36 - 46 %   MCV 84.4 80.0 - 100.0 fL   MCH 26.7 26.0 - 34.0 pg   MCHC 31.7 30.0 - 36.0 g/dL   RDW 40.8 14.4 - 81.8 %   Platelet Count 248 150 - 400 K/uL   nRBC 0.0  0.0 - 0.2 %   Neutrophils Relative % 50 %   Neutro Abs 3.2 1.7 - 7.7 K/uL   Lymphocytes Relative 35 %   Lymphs Abs 2.3 0.7 - 4.0 K/uL   Monocytes Relative 7 %   Monocytes Absolute 0.5 0 - 1 K/uL   Eosinophils Relative 6 %   Eosinophils Absolute 0.4 0 - 0 K/uL   Basophils Relative 1 %   Basophils Absolute 0.1 0 - 0 K/uL   Immature Granulocytes 1 %   Abs Immature Granulocytes 0.09 (H) 0.00 - 0.07 K/uL    Comment: Performed at [facility] at [facility], [address] [place], [place] [address]  Reticulocytes     Status: Abnormal   Collection Time: 12/10/19  8:43 AM  Result Value Ref Range   Retic Ct Pct 1.3 0.4 - 3.1 %   RBC. 5.19 (H) 3.87 - 5.11 MIL/uL   Retic Count, Absolute 69.5 19.0 - 186.0 K/uL   Immature Retic Fract 12.9 2.3 - 15.9 %    Comment: Performed at [facility] at [facility], [address], [place], [place] [address]  CMP (Cancer Center only)     Status: Abnormal   Collection Time: 12/10/19  8:43 AM  Result Value Ref Range  Sodium 141 135 - 145 mmol/L   Potassium 4.0 3.5 - 5.1 mmol/L   Chloride 103 98 - 111 mmol/L   CO2 30 22 - 32 mmol/L   Glucose, Bld 100 (H) 70 - 99 mg/dL    Comment: Glucose reference range applies only to samples taken after fasting for at least 8 hours.   BUN 19 6 - 20 mg/dL   Creatinine 8.290.94 5.620.44 - 1.00 mg/dL   Calcium 13.010.0 8.9 - 86.510.3 mg/dL   Total Protein 6.7 6.5 - 8.1 g/dL   Albumin 4.3 3.5 - 5.0 g/dL   AST 20 15 - 41 U/L   ALT 15 0 - 44 U/L   Alkaline Phosphatase 102 38 - 126 U/L   Total Bilirubin 0.4 0.3 - 1.2 mg/dL   GFR, Est Non Af Am >78>60 >60 mL/min   GFR, Est AFR Am >60 >60 mL/min   Anion gap 8 5 - 15    Comment: Performed at [facility], [address] [place], [place] [address]  Save Smear [facility])     Status: None   Collection Time: 12/10/19  8:43 AM  Result Value Ref Range   Smear Review SMEAR STAINED AND AVAILABLE FOR REVIEW     Comment:  Performed at [facility] at [facility] [place], [address], [place], [place] [address]      RADIOGRAPHY: No results found.     PATHOLOGY: None  ASSESSMENT/PLAN: Ms. Remigio EisenmengerCramer is a very pleasant 54 yo caucasian female with diagnosis of iron deficiency earlier this year felt to be due to frequent blood donation in the past and intermittent GI blood loss.  She will get Feraheme today and again next week.  Erythropoietin level is pending and Dr. Myna HidalgoEnnever will review blood smear later today.  We will plan to see her again in another 6 weeks.   All questions were answered and she is in agreement with the plan. She can contact our office with any questions or concerns. We can certainly see her sooner if needed.    Emeline GinsSarah Ireland Chagnon, NP

## 2019-12-11 LAB — ERYTHROPOIETIN: Erythropoietin: 7.5 m[IU]/mL (ref 2.6–18.5)

## 2019-12-17 ENCOUNTER — Other Ambulatory Visit: Payer: Self-pay

## 2019-12-17 ENCOUNTER — Inpatient Hospital Stay: Payer: BC Managed Care – PPO

## 2019-12-17 VITALS — BP 136/83 | HR 88 | Temp 98.5°F | Resp 17

## 2019-12-17 DIAGNOSIS — D500 Iron deficiency anemia secondary to blood loss (chronic): Secondary | ICD-10-CM

## 2019-12-17 DIAGNOSIS — D509 Iron deficiency anemia, unspecified: Secondary | ICD-10-CM | POA: Diagnosis not present

## 2019-12-17 MED ORDER — SODIUM CHLORIDE 0.9 % IV SOLN
Freq: Once | INTRAVENOUS | Status: AC
  Filled 2019-12-17: qty 250

## 2019-12-17 MED ORDER — METHYLPREDNISOLONE SODIUM SUCC 125 MG IJ SOLR
80.0000 mg | Freq: Once | INTRAMUSCULAR | Status: AC
  Administered 2019-12-17: 80 mg via INTRAVENOUS

## 2019-12-17 MED ORDER — FAMOTIDINE IN NACL 20-0.9 MG/50ML-% IV SOLN
20.0000 mg | Freq: Once | INTRAVENOUS | Status: AC
  Administered 2019-12-17: 20 mg via INTRAVENOUS

## 2019-12-17 MED ORDER — SODIUM CHLORIDE 0.9 % IV SOLN
510.0000 mg | Freq: Once | INTRAVENOUS | Status: AC
  Administered 2019-12-17: 510 mg via INTRAVENOUS
  Filled 2019-12-17: qty 17

## 2019-12-17 MED ORDER — FAMOTIDINE IN NACL 20-0.9 MG/50ML-% IV SOLN
INTRAVENOUS | Status: AC
  Filled 2019-12-17: qty 50

## 2019-12-17 MED ORDER — METHYLPREDNISOLONE SODIUM SUCC 40 MG IJ SOLR
INTRAMUSCULAR | Status: AC
  Filled 2019-12-17: qty 2

## 2019-12-17 NOTE — Progress Notes (Signed)
Patient here today for fereheme. Had first dose on 8/11/021. States a couple hours afetr her last infusion she felt "drunk and dizzy" States she felt completely back to normal the next morning. Informed MD. Verbal order for Pepcid 20mg  and Solu Medrol 80mg  prior to iron infusion. Pharmacy informed.

## 2019-12-18 ENCOUNTER — Ambulatory Visit: Payer: BC Managed Care – PPO

## 2020-01-21 ENCOUNTER — Inpatient Hospital Stay: Payer: BC Managed Care – PPO | Attending: Family

## 2020-01-21 ENCOUNTER — Inpatient Hospital Stay (HOSPITAL_BASED_OUTPATIENT_CLINIC_OR_DEPARTMENT_OTHER): Payer: BC Managed Care – PPO | Admitting: Hematology & Oncology

## 2020-01-21 ENCOUNTER — Encounter: Payer: Self-pay | Admitting: Hematology & Oncology

## 2020-01-21 ENCOUNTER — Other Ambulatory Visit: Payer: Self-pay

## 2020-01-21 VITALS — BP 149/88 | HR 104 | Temp 98.3°F | Resp 18 | Wt 165.5 lb

## 2020-01-21 DIAGNOSIS — D508 Other iron deficiency anemias: Secondary | ICD-10-CM | POA: Diagnosis present

## 2020-01-21 DIAGNOSIS — D500 Iron deficiency anemia secondary to blood loss (chronic): Secondary | ICD-10-CM

## 2020-01-21 DIAGNOSIS — D509 Iron deficiency anemia, unspecified: Secondary | ICD-10-CM

## 2020-01-21 LAB — CBC WITH DIFFERENTIAL (CANCER CENTER ONLY)
Abs Immature Granulocytes: 0.03 10*3/uL (ref 0.00–0.07)
Basophils Absolute: 0.1 10*3/uL (ref 0.0–0.1)
Basophils Relative: 1 %
Eosinophils Absolute: 0.4 10*3/uL (ref 0.0–0.5)
Eosinophils Relative: 5 %
HCT: 44.1 % (ref 36.0–46.0)
Hemoglobin: 14.4 g/dL (ref 12.0–15.0)
Immature Granulocytes: 0 %
Lymphocytes Relative: 27 %
Lymphs Abs: 2 10*3/uL (ref 0.7–4.0)
MCH: 28.4 pg (ref 26.0–34.0)
MCHC: 32.7 g/dL (ref 30.0–36.0)
MCV: 87 fL (ref 80.0–100.0)
Monocytes Absolute: 0.6 10*3/uL (ref 0.1–1.0)
Monocytes Relative: 8 %
Neutro Abs: 4.3 10*3/uL (ref 1.7–7.7)
Neutrophils Relative %: 59 %
Platelet Count: 257 10*3/uL (ref 150–400)
RBC: 5.07 MIL/uL (ref 3.87–5.11)
RDW: 15.3 % (ref 11.5–15.5)
WBC Count: 7.3 10*3/uL (ref 4.0–10.5)
nRBC: 0 % (ref 0.0–0.2)

## 2020-01-21 LAB — CMP (CANCER CENTER ONLY)
ALT: 25 U/L (ref 0–44)
AST: 25 U/L (ref 15–41)
Albumin: 4.4 g/dL (ref 3.5–5.0)
Alkaline Phosphatase: 93 U/L (ref 38–126)
Anion gap: 9 (ref 5–15)
BUN: 15 mg/dL (ref 6–20)
CO2: 28 mmol/L (ref 22–32)
Calcium: 9.9 mg/dL (ref 8.9–10.3)
Chloride: 104 mmol/L (ref 98–111)
Creatinine: 0.89 mg/dL (ref 0.44–1.00)
GFR, Est AFR Am: >60 mL/min (ref >60)
GFR, Estimated: >60 mL/min (ref >60)
Glucose, Bld: 101 mg/dL — ABNORMAL HIGH (ref 70–99)
Potassium: 4 mmol/L (ref 3.5–5.1)
Sodium: 141 mmol/L (ref 135–145)
Total Bilirubin: 0.3 mg/dL (ref 0.3–1.2)
Total Protein: 7 g/dL (ref 6.5–8.1)

## 2020-01-21 LAB — RETICULOCYTES
Immature Retic Fract: 9.9 % (ref 2.3–15.9)
RBC.: 5 MIL/uL (ref 3.87–5.11)
Retic Count, Absolute: 82 10*3/uL (ref 19.0–186.0)
Retic Ct Pct: 1.6 % (ref 0.4–3.1)

## 2020-01-21 NOTE — Progress Notes (Signed)
Hematology and Oncology Follow Up Visit  Angela Adams [id number] 05-15-1965 54 y.o. 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  This is her second office visit.  She actually is an aunt of a patient of mine.  He was a incredible young man.  He actually had metastatic testicular cancer.  He passed on probably 11 years ago.  It was wonderful to talk to her about him.  Angela Adams is incredibly interesting.  She is originally from [place] but Adams lived in different places.  She is a big [employer].  She was found to be iron deficient by Dr. Barron Alvine.  She was sent over to see [place].  She been doing a lot of donations to the [employer].  Her blood type is O+.  When she was seen by [place], her ferritin was 7 with iron saturation of 16%.  She did go ahead and get some IV iron.  At that time, her MCV was 84.  She Adams had no obvious bleeding.  She Adams had no issues with her appetite.  She is not a vegetarian.  She Adams had no cough or shortness of breath.  She Adams had no rashes.  There is been no leg swelling.  Overall, her performance status is ECOG 1.  Medications:  Current Outpatient Medications:  .  amphetamine-dextroamphetamine (ADDERALL XR) 30 MG 24 hr capsule, Take 30 mg by mouth daily., Disp: , Rfl:  .  B Complex Vitamins (VITAMIN B COMPLEX PO), Take 1 tablet by mouth daily. , Disp: , Rfl:  .  Cholecalciferol (VITAMIN D3) 3000 units TABS, Take by mouth., Disp: , Rfl:  .  desonide (DESOWEN) 0.05 % ointment, Apply twice daily as needed for red itchy areas., Disp: 15 g, Rfl: 5 .  EPINEPHrine (AUVI-Q) 0.3 mg/0.3 mL IJ SOAJ injection, Use as directed for severe allergic reactions, Disp: 2 Device, Rfl: 3 .  estradiol (ESTRACE) 1 MG tablet, Take 1 mg by mouth daily., Disp: , Rfl:  .   ferrous sulfate 325 (65 FE) MG tablet, Take 325 mg by mouth daily with breakfast., Disp: , Rfl:  .  FIBER PO, Take 1 tablet by mouth daily. , Disp: , Rfl:  .  Garlic 100 MG TABS, Take by mouth daily. , Disp: , Rfl:  .  levocetirizine (XYZAL) 5 MG tablet, Take 1 tablet (5 mg total) by mouth every evening., Disp: 30 tablet, Rfl: 0 .  Multiple Vitamins-Minerals (ZINC PO), Take by mouth., Disp: , Rfl:  .  Omega-3 Fatty Acids (FISH OIL) 1000 MG CAPS, Take 1 tablet by mouth 2 (two) times daily., Disp: , Rfl:  .  omeprazole (PRILOSEC) 40 MG capsule, Take 1 capsule (40 mg total) by mouth daily., Disp: 30 capsule, Rfl: 3 .  sucralfate (CARAFATE) 1 GM/10ML suspension, Take 10 mLs (1 g total) by mouth 4 (four) times daily -  with meals and at bedtime., Disp: 420 mL, Rfl: 0 .  vitamin A 16010 UNIT capsule, Take 10,000 Units by mouth daily., Disp: , Rfl:  .  vitamin C (ASCORBIC ACID) 500 MG tablet, Take 500 mg by mouth daily., Disp: , Rfl:  .  vitamin E 1000 UNIT capsule, Take 1,000 Units by mouth daily., Disp: , Rfl:  .  azelastine (ASTELIN) 0.1 % nasal spray, Place 2 sprays into both nostrils as needed. , Disp: , Rfl:  .  fluticasone (FLONASE) 50 MCG/ACT nasal spray, Place 2 sprays into both nostrils as needed. , Disp: , Rfl:   Allergies:  Allergies  Allergen Reactions  . Tetracyclines & Related Hives  . Erythrocin Hives  . Hydrocodone Hives  . Oxycodone Itching    Patient reports she CAN take "HYDROCODONE" and "CODEINE" WITHOUT problems    Past Medical History, Surgical history, Social history, and Family History were reviewed and updated.  Review of Systems: Review of Systems  Constitutional: Negative.   HENT:  Negative.   Eyes: Negative.   Respiratory: Negative.   Cardiovascular: Negative.   Gastrointestinal: Negative.   Endocrine: Negative.   Genitourinary: Negative.    Musculoskeletal: Negative.   Skin: Negative.   Neurological: Negative.   Hematological: Negative.    Psychiatric/Behavioral: Negative.     Physical Exam:  weight is 165 lb 8 oz (75.1 kg). Her oral temperature is 98.3 F (36.8 C). Her blood pressure is 149/88 (abnormal) and her pulse is 104 (abnormal). Her respiration is 18 and oxygen saturation is 100%.   Wt Readings from Last 3 Encounters:  01/21/20 165 lb 8 oz (75.1 kg)  12/10/19 163 lb (73.9 kg)  12/02/19 162 lb (73.5 kg)    Physical Exam Vitals reviewed.  HENT:     Head: Normocephalic and atraumatic.  Eyes:     Pupils: Pupils are equal, round, and reactive to light.  Cardiovascular:     Rate and Rhythm: Normal rate and regular rhythm.     Heart sounds: Normal heart sounds.  Pulmonary:     Effort: Pulmonary effort is normal.     Breath sounds: Normal breath sounds.  Abdominal:     General: Bowel sounds are normal.     Palpations: Abdomen is soft.  Musculoskeletal:        General: No tenderness or deformity. Normal range of motion.     Cervical back: Normal range of motion.  Lymphadenopathy:     Cervical: No cervical adenopathy.  Skin:    General: Skin is warm and dry.     Findings: No erythema or rash.  Neurological:     Mental Status: She is alert and oriented to person, place, and time.  Psychiatric:        Behavior: Behavior normal.        Thought Content: Thought content normal.        Judgment: Judgment normal.    Lab Results  Component Value Date   WBC 7.3 01/21/2020   HGB 14.4 01/21/2020   HCT 44.1 01/21/2020   MCV 87.0 01/21/2020   PLT 257 01/21/2020     Chemistry      Component Value Date/Time   NA 141 01/21/2020 1457   K 4.0 01/21/2020 1457   CL 104 01/21/2020 1457   CO2 28 01/21/2020 1457   BUN 15 01/21/2020 1457   CREATININE 0.89 01/21/2020 1457      Component Value Date/Time   CALCIUM 9.9 01/21/2020 1457   ALKPHOS 93 01/21/2020 1457   AST 25 01/21/2020 1457   ALT 25 01/21/2020 1457   BILITOT 0.3 01/21/2020 1457      Impression and Plan: Angela Adams is a very charming  54 year old white female.  She Adams iron deficiency anemia secondary to blood donations.  I cannot imagine that there is anything else that Adams caused this.  Her MCV is coming up  quite nicely.  The IV iron that she received Adams clearly helped.  I told her that if she wanted to donate blood, she probably could.  I do not see a problem with her doing this now.  However, she should only donate twice a year.  We will see what her iron studies look like.  Given her MCV of 87, I would have to think that her iron should be okay.  We likely will plan to get her back after the holidays just to follow-up with her.   Josph Macho, MD 9/22/20214:43 PM

## 2020-01-22 ENCOUNTER — Telehealth: Payer: Self-pay | Admitting: Hematology & Oncology

## 2020-01-22 LAB — IRON AND TIBC
Iron: 92 ug/dL (ref 41–142)
Saturation Ratios: 29 % (ref 21–57)
TIBC: 315 ug/dL (ref 236–444)
UIBC: 223 ug/dL (ref 120–384)

## 2020-01-22 LAB — FERRITIN: Ferritin: 313 ng/mL — ABNORMAL HIGH (ref 11–307)

## 2020-01-22 NOTE — Telephone Encounter (Signed)
Appointments scheduled calendar printed & mailed per 9/22 los 

## 2020-05-21 ENCOUNTER — Encounter: Payer: Self-pay | Admitting: Hematology & Oncology

## 2020-05-21 ENCOUNTER — Other Ambulatory Visit: Payer: Self-pay

## 2020-05-21 ENCOUNTER — Inpatient Hospital Stay: Payer: BC Managed Care – PPO | Attending: Hematology & Oncology

## 2020-05-21 ENCOUNTER — Inpatient Hospital Stay (HOSPITAL_BASED_OUTPATIENT_CLINIC_OR_DEPARTMENT_OTHER): Payer: BC Managed Care – PPO | Admitting: Hematology & Oncology

## 2020-05-21 VITALS — BP 145/88 | HR 86 | Temp 98.3°F | Resp 16 | Wt 174.0 lb

## 2020-05-21 DIAGNOSIS — J301 Allergic rhinitis due to pollen: Secondary | ICD-10-CM

## 2020-05-21 DIAGNOSIS — D500 Iron deficiency anemia secondary to blood loss (chronic): Secondary | ICD-10-CM

## 2020-05-21 DIAGNOSIS — D509 Iron deficiency anemia, unspecified: Secondary | ICD-10-CM | POA: Insufficient documentation

## 2020-05-21 LAB — CBC WITH DIFFERENTIAL (CANCER CENTER ONLY)
Abs Immature Granulocytes: 0.07 10*3/uL (ref 0.00–0.07)
Basophils Absolute: 0.1 10*3/uL (ref 0.0–0.1)
Basophils Relative: 1 %
Eosinophils Absolute: 0.3 10*3/uL (ref 0.0–0.5)
Eosinophils Relative: 5 %
HCT: 42.4 % (ref 36.0–46.0)
Hemoglobin: 14.2 g/dL (ref 12.0–15.0)
Immature Granulocytes: 1 %
Lymphocytes Relative: 38 %
Lymphs Abs: 2.4 10*3/uL (ref 0.7–4.0)
MCH: 30.1 pg (ref 26.0–34.0)
MCHC: 33.5 g/dL (ref 30.0–36.0)
MCV: 89.8 fL (ref 80.0–100.0)
Monocytes Absolute: 0.6 10*3/uL (ref 0.1–1.0)
Monocytes Relative: 10 %
Neutro Abs: 2.8 10*3/uL (ref 1.7–7.7)
Neutrophils Relative %: 45 %
Platelet Count: 265 10*3/uL (ref 150–400)
RBC: 4.72 MIL/uL (ref 3.87–5.11)
RDW: 11.9 % (ref 11.5–15.5)
WBC Count: 6.3 10*3/uL (ref 4.0–10.5)
nRBC: 0 % (ref 0.0–0.2)

## 2020-05-21 LAB — CMP (CANCER CENTER ONLY)
ALT: 29 U/L (ref 0–44)
AST: 28 U/L (ref 15–41)
Albumin: 4.2 g/dL (ref 3.5–5.0)
Alkaline Phosphatase: 78 U/L (ref 38–126)
Anion gap: 8 (ref 5–15)
BUN: 14 mg/dL (ref 6–20)
CO2: 28 mmol/L (ref 22–32)
Calcium: 10.1 mg/dL (ref 8.9–10.3)
Chloride: 103 mmol/L (ref 98–111)
Creatinine: 0.8 mg/dL (ref 0.44–1.00)
GFR, Estimated: >60 mL/min (ref >60)
Glucose, Bld: 76 mg/dL (ref 70–99)
Potassium: 3.9 mmol/L (ref 3.5–5.1)
Sodium: 139 mmol/L (ref 135–145)
Total Bilirubin: 0.3 mg/dL (ref 0.3–1.2)
Total Protein: 6.8 g/dL (ref 6.5–8.1)

## 2020-05-21 LAB — RETICULOCYTES
Immature Retic Fract: 11.9 % (ref 2.3–15.9)
RBC.: 4.69 MIL/uL (ref 3.87–5.11)
Retic Count, Absolute: 71.3 10*3/uL (ref 19.0–186.0)
Retic Ct Pct: 1.5 % (ref 0.4–3.1)

## 2020-05-21 NOTE — Progress Notes (Signed)
Hematology and Oncology Follow Up Visit  Angela Adams [id number] 01/12/1966 55 y.o. 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  As always, it is fun talking with her.  She is doing quite nicely.  Her daughter is getting married in May.  She is looking forward to this.  Then, a niece is getting married out in [place] in the summer.  She received iron back in August.  So far, the iron has helped her..  She is donating to the [employer].  I think she can do this now.  Her blood is holding steady.  Her iron stores are being replaced.  She has had no problems with the coronavirus.  That she has had no change in bowel or bladder habits.  There has been no rashes.  She has had no leg swelling.  There has been no cough.  When we last saw her, her iron studies showed a ferritin of 313 with iron saturation of 29%.  Currently, her performance status is ECOG 0.    Medications:  Current Outpatient Medications:    amphetamine-dextroamphetamine (ADDERALL XR) 30 MG 24 hr capsule, Take 30 mg by mouth daily., Disp: , Rfl:    azelastine (ASTELIN) 0.1 % nasal spray, Place 2 sprays into both nostrils as needed. , Disp: , Rfl:    B Complex Vitamins (VITAMIN B COMPLEX PO), Take 1 tablet by mouth daily. , Disp: , Rfl:    Cholecalciferol (VITAMIN D3) 3000 units TABS, Take by mouth., Disp: , Rfl:    desonide (DESOWEN) 0.05 % ointment, Apply twice daily as needed for red itchy areas., Disp: 15 g, Rfl: 5   EPINEPHrine (AUVI-Q) 0.3 mg/0.3 mL IJ SOAJ injection, Use as directed for severe allergic reactions, Disp: 2 Device, Rfl: 3   estradiol (ESTRACE) 1 MG tablet, Take 1 mg by mouth daily., Disp: , Rfl:    ferrous sulfate 325 (65 FE) MG tablet, Take 325 mg by mouth daily with breakfast., Disp: , Rfl:     FIBER PO, Take 1 tablet by mouth daily. , Disp: , Rfl:    fluticasone (FLONASE) 50 MCG/ACT nasal spray, Place 2 sprays into both nostrils as needed. , Disp: , Rfl:    Garlic 100 MG TABS, Take by mouth daily. , Disp: , Rfl:    levocetirizine (XYZAL) 5 MG tablet, Take 1 tablet (5 mg total) by mouth every evening., Disp: 30 tablet, Rfl: 0   Multiple Vitamins-Minerals (ZINC PO), Take by mouth., Disp: , Rfl:    Omega-3 Fatty Acids (FISH OIL) 1000 MG CAPS, Take 1 tablet by mouth 2 (two) times daily., Disp: , Rfl:    omeprazole (PRILOSEC) 40 MG capsule, Take 1 capsule (40 mg total) by mouth daily., Disp: 30 capsule, Rfl: 3   sucralfate (CARAFATE) 1 GM/10ML suspension, Take 10 mLs (1 g total) by mouth 4 (four) times daily -  with meals and at bedtime., Disp: 420 mL, Rfl: 0   vitamin A 35701 UNIT capsule, Take 10,000 Units by mouth daily., Disp: , Rfl:    vitamin C (ASCORBIC ACID) 500 MG tablet, Take 500 mg by mouth daily., Disp: , Rfl:    vitamin E 1000 UNIT capsule, Take 1,000 Units by mouth daily., Disp: , Rfl:   Allergies:  Allergies  Allergen Reactions   Tetracyclines & Related Hives   Erythrocin Hives   Hydrocodone Hives   Oxycodone Itching    Patient reports she CAN take "HYDROCODONE" and "CODEINE" WITHOUT problems    Past Medical History, Surgical history, Social history, and Family History were reviewed and updated.  Review of Systems: Review of Systems  Constitutional: Negative.   HENT:  Negative.   Eyes: Negative.   Respiratory: Negative.   Cardiovascular: Negative.   Gastrointestinal: Negative.   Endocrine: Negative.   Genitourinary: Negative.    Musculoskeletal: Negative.   Skin: Negative.   Neurological: Negative.   Hematological: Negative.   Psychiatric/Behavioral: Negative.     Physical Exam:  weight is 174 lb (78.9 kg). Her oral temperature is 98.3 F (36.8 C). Her blood pressure is 145/88 (abnormal) and her pulse is 86. Her respiration is 16 and  oxygen saturation is 99%.   Wt Readings from Last 3 Encounters:  05/21/20 174 lb (78.9 kg)  01/21/20 165 lb 8 oz (75.1 kg)  12/10/19 163 lb (73.9 kg)    Physical Exam Vitals reviewed.  HENT:     Head: Normocephalic and atraumatic.  Eyes:     Pupils: Pupils are equal, round, and reactive to light.  Cardiovascular:     Rate and Rhythm: Normal rate and regular rhythm.     Heart sounds: Normal heart sounds.  Pulmonary:     Effort: Pulmonary effort is normal.     Breath sounds: Normal breath sounds.  Abdominal:     General: Bowel sounds are normal.     Palpations: Abdomen is soft.  Musculoskeletal:        General: No tenderness or deformity. Normal range of motion.     Cervical back: Normal range of motion.  Lymphadenopathy:     Cervical: No cervical adenopathy.  Skin:    General: Skin is warm and dry.     Findings: No erythema or rash.  Neurological:     Mental Status: She is alert and oriented to person, place, and time.  Psychiatric:        Behavior: Behavior normal.        Thought Content: Thought content normal.        Judgment: Judgment normal.    Lab Results  Component Value Date   WBC 6.3 05/21/2020   HGB 14.2 05/21/2020   HCT 42.4 05/21/2020   MCV 89.8 05/21/2020   PLT 265 05/21/2020     Chemistry      Component Value Date/Time   NA 139 05/21/2020 1425   K 3.9 05/21/2020 1425   CL 103 05/21/2020 1425   CO2 28 05/21/2020 1425   BUN 14 05/21/2020 1425   CREATININE 0.80 05/21/2020 1425      Component Value Date/Time   CALCIUM 10.1 05/21/2020 1425   ALKPHOS 78 05/21/2020 1425   AST 28 05/21/2020 1425   ALT 29 05/21/2020 1425   BILITOT 0.3 05/21/2020 1425      Impression and Plan: Angela Adams is a very charming 55 year old white female.  She has iron deficiency anemia secondary to blood donations.  I cannot imagine that there is anything else that has caused this.  Her MCV has come up quite nicely.  The IV iron that she received has clearly  helped.  I think that it would be okay for her to donate blood.  Again, her hemoglobin is holding steady.  We will see what her iron studies show.  Given that there is a blood  shortage right now, I think that donating her blood will certainly help the community.  At this point, I just do not think we have to see her back.  I told her that she can always come back to see [place] if she thinks her blood is low.  We would be more than happy to check her blood at that point.  As always it is somewhat fun talking to her.  She is a big [employer].  I finally was able to gloat because the [facility] meet them in the baseball playoffs.    Josph Macho, MD 1/21/20224:13 PM

## 2020-05-24 ENCOUNTER — Telehealth: Payer: Self-pay | Admitting: Hematology & Oncology

## 2020-05-24 LAB — IRON AND TIBC
Iron: 91 ug/dL (ref 41–142)
Saturation Ratios: 25 % (ref 21–57)
TIBC: 361 ug/dL (ref 236–444)
UIBC: 270 ug/dL (ref 120–384)

## 2020-05-24 LAB — FERRITIN: Ferritin: 135 ng/mL (ref 11–307)

## 2020-05-24 NOTE — Telephone Encounter (Signed)
No los 12/1 

## 2020-10-11 IMAGING — CT CT ANGIO CHEST
3 of 9 series · 18 of 36 positions shown · IV contrast (Omnipaque)
Comparison: None.

CLINICAL DATA: Epigastric pain radiating into back.

EXAM:
CT ANGIOGRAPHY CHEST WITH CONTRAST
TECHNIQUE: Multidetector CT imaging of the chest was performed using the
standard protocol during bolus administration of intravenous
contrast. Multiplanar CT image reconstructions and MIPs were
obtained to evaluate the vascular anatomy.
CONTRAST:  50mL OMNIPAQUE IOHEXOL 350 MG/ML SOLN

[Series 5: pe thins · axial · 0.69mm/px · z∈[-299,-44]mm · 14 of 295 slices shown]
[im 20/295  lung]
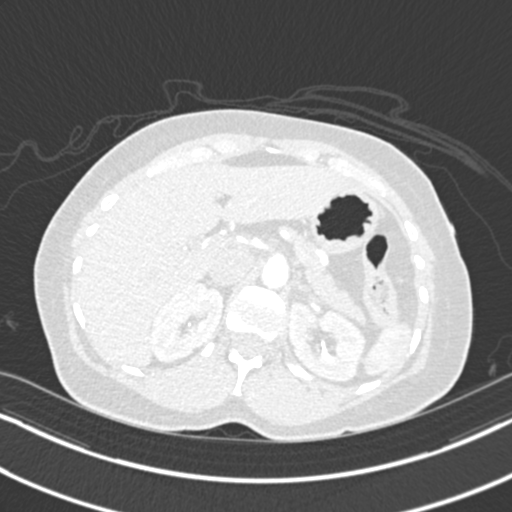
[im 40/295  mediastinal]
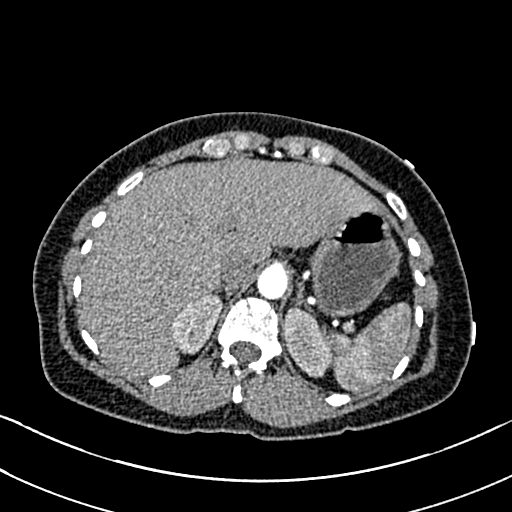
[im 59/295  lung]
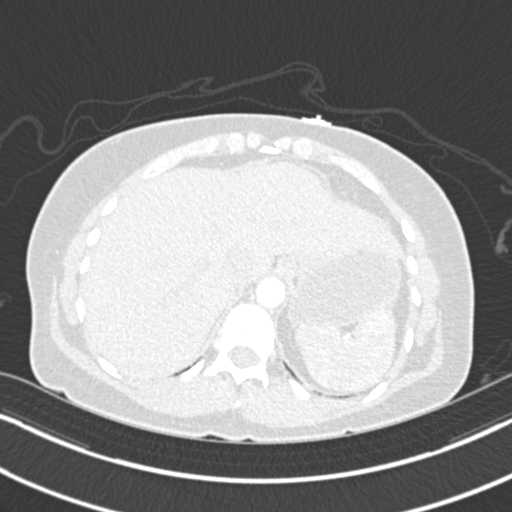
[im 79/295  mediastinal]
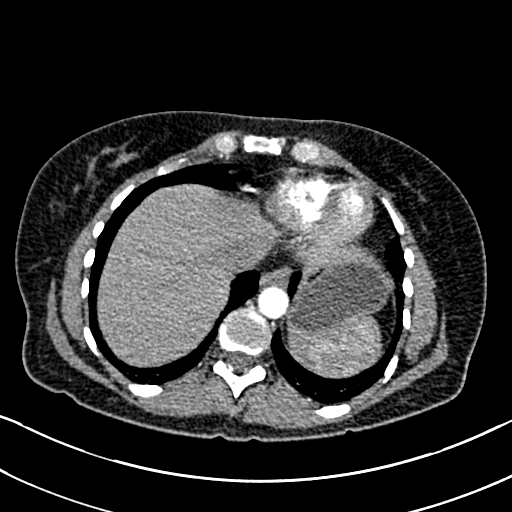
[im 99/295  lung]
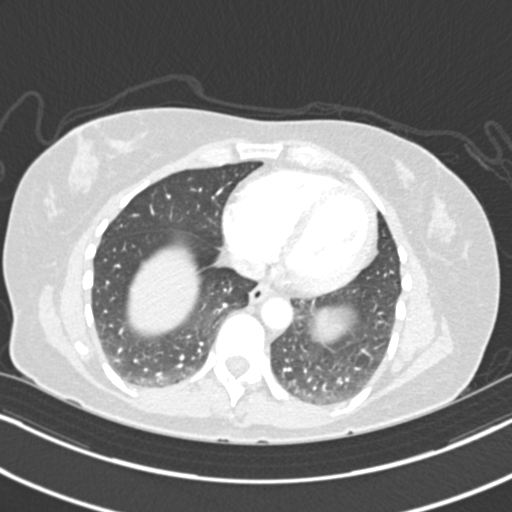
[im 118/295  mediastinal]
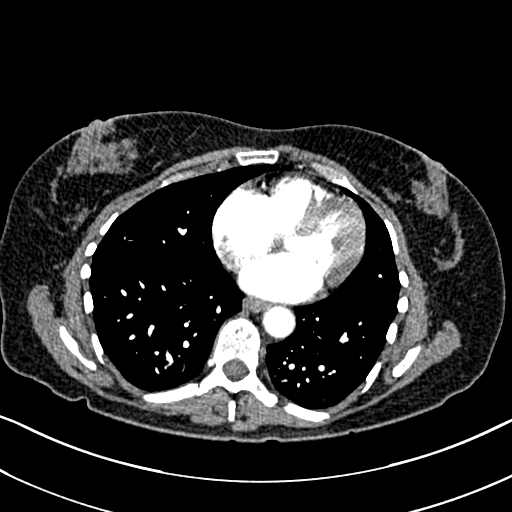
[im 138/295  lung]
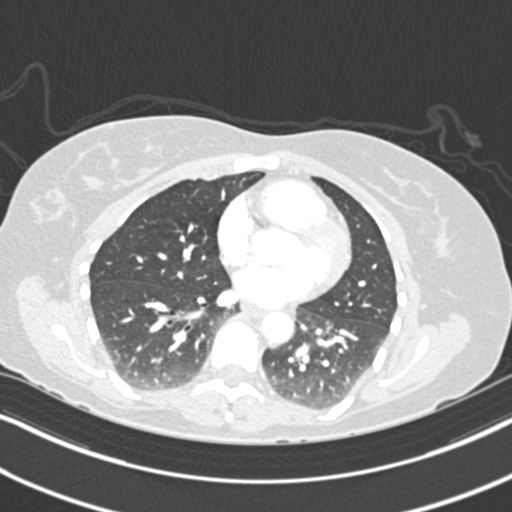
[im 157/295  mediastinal]
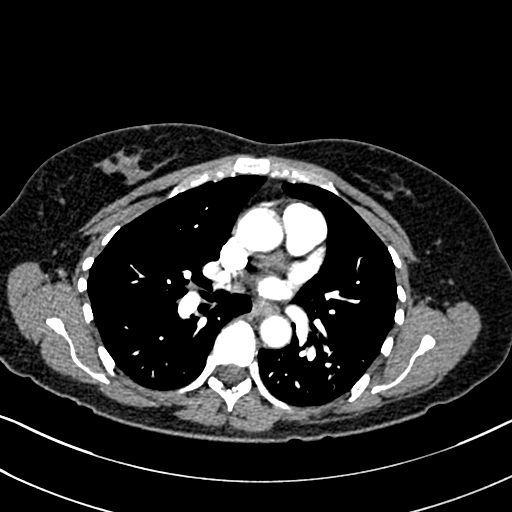
[im 177/295  lung]
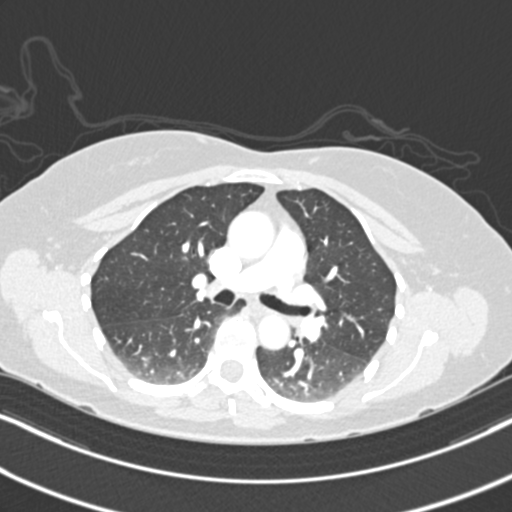
[im 197/295  mediastinal]
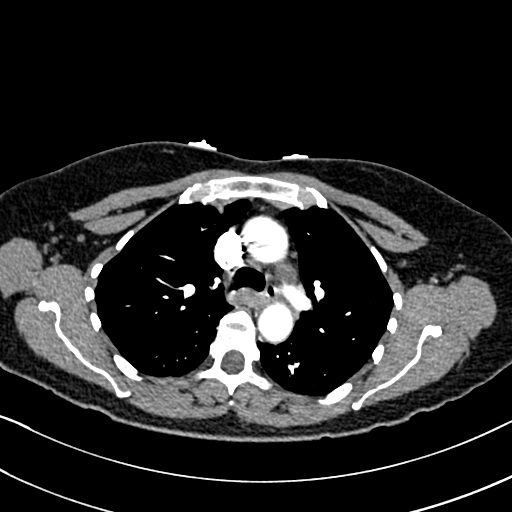
[im 216/295  lung]
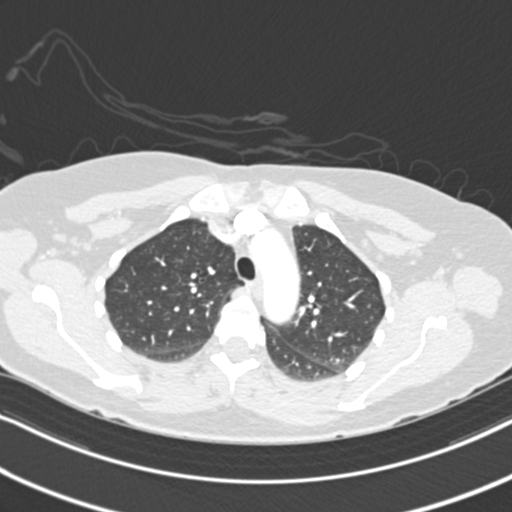
[im 236/295  mediastinal]
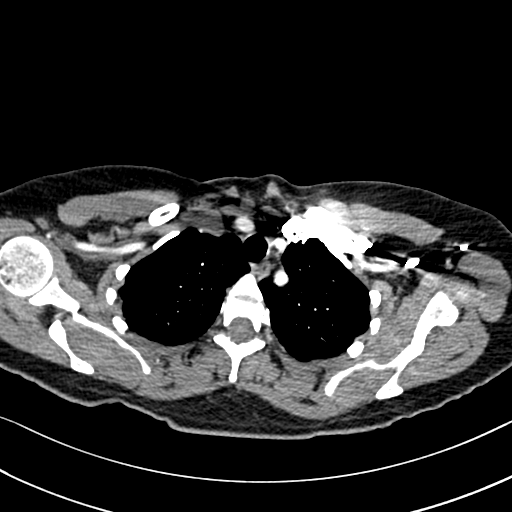
[im 255/295  lung]
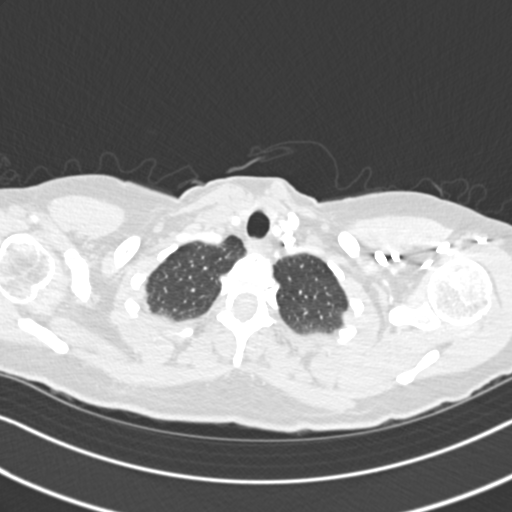
[im 275/295  mediastinal]
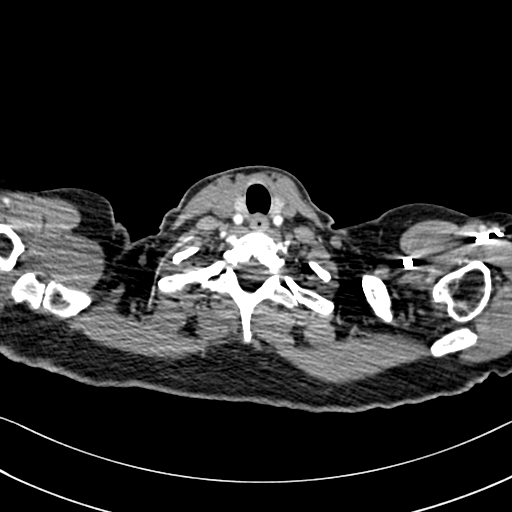

[Series 6: pe lung · axial · 0.94mm/px · z∈[-201,-84]mm · 3 of 79 slices shown]
[im 20/79  mediastinal]
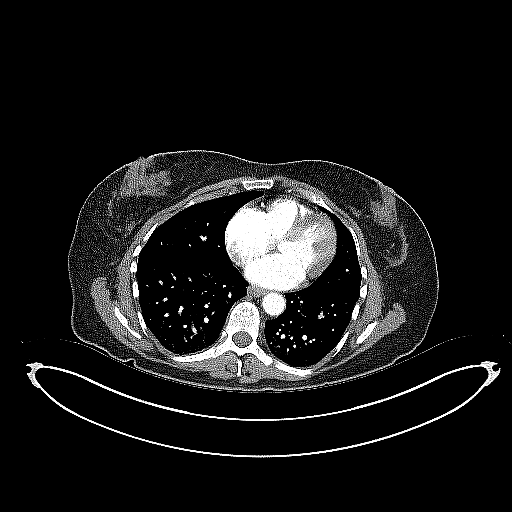
[im 40/79  mediastinal]
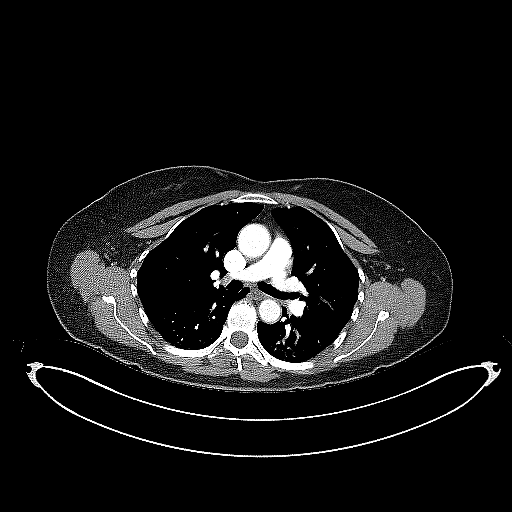
[im 59/79  mediastinal]
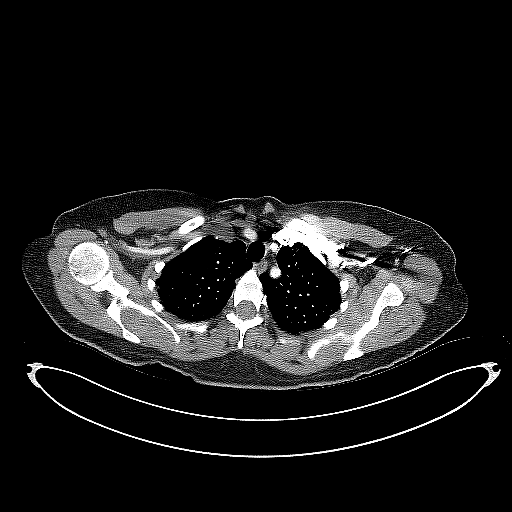

[Series 7: pe coronal mpr · coronal · 0.59mm/px · 1 of 121 slices shown]
[im 61/121  mediastinal]
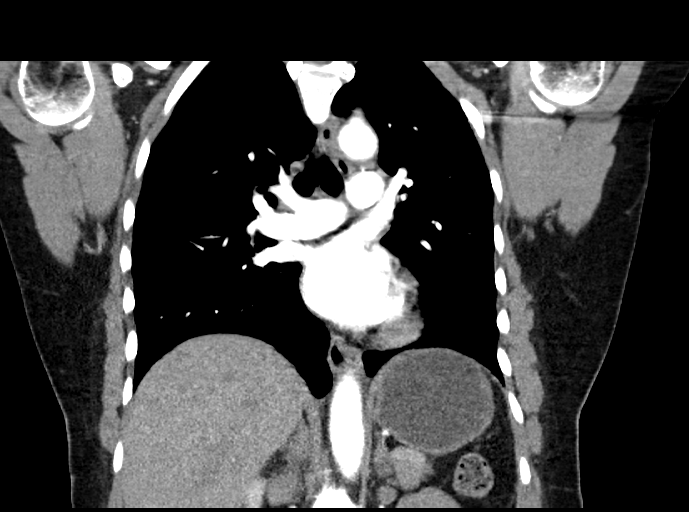

[18 of 36 positions shown; findings below may reference images not displayed]

FINDINGS: Cardiovascular: The pulmonary arteries are well opacified. There is
no evidence of acute pulmonary embolism. Central pulmonary arteries
are normal in caliber. The thoracic aorta is also well opacified and
demonstrates normal caliber without evidence of aneurysmal disease,
atherosclerosis or dissection. The heart size is normal. No
pericardial fluid identified. No visualized calcified plaque in the
distribution of the coronary arteries.

Mediastinum/Nodes: No enlarged mediastinal, hilar, or axillary lymph
nodes. Thyroid gland, trachea, and esophagus demonstrate no
significant findings.

Lungs/Pleura: There is no evidence of pulmonary edema,
consolidation, pneumothorax, nodule or pleural fluid.

Upper Abdomen: No acute abnormality.

Musculoskeletal: No chest wall abnormality. No acute or significant
osseous findings.

Review of the MIP images confirms the above findings.
IMPRESSION: Unremarkable CTA of the chest demonstrating no evidence of acute
pulmonary embolism or other acute findings.
